# Patient Record
Sex: Male | Born: 1970
Health system: Southern US, Community
[De-identification: ages and names within clinical notes are randomized; demographics above are authoritative.]

## PROBLEM LIST (undated history)

## (undated) DIAGNOSIS — K589 Irritable bowel syndrome without diarrhea: Secondary | ICD-10-CM

---

## 2007-12-15 ENCOUNTER — Other Ambulatory Visit: Payer: Self-pay

## 2007-12-16 ENCOUNTER — Inpatient Hospital Stay: Payer: Self-pay | Admitting: Unknown Physician Specialty

## 2007-12-16 ENCOUNTER — Observation Stay: Payer: Self-pay | Admitting: Internal Medicine

## 2008-01-08 ENCOUNTER — Ambulatory Visit: Payer: Self-pay | Admitting: Psychiatry

## 2008-01-22 ENCOUNTER — Ambulatory Visit: Payer: Self-pay | Admitting: Psychiatry

## 2008-01-29 ENCOUNTER — Ambulatory Visit: Payer: Self-pay | Admitting: Psychiatry

## 2008-12-19 IMAGING — CR DG ABDOMEN 1V
1 series · 2 of 2 positions shown · non-contrast
Comparison: none

REASON FOR EXAM: s/p NG placement
COMMENTS:   Bedside (portable):Y

PROCEDURE:     DXR - DXR KIDNEY URETER BLADDER  - December 16, 2007 [DATE]
RESULT:     Comparison: No available comparison exam.

[Series 1: view not recorded · 0.17mm/px · 2 of 2 slices shown]
[im 1/2]
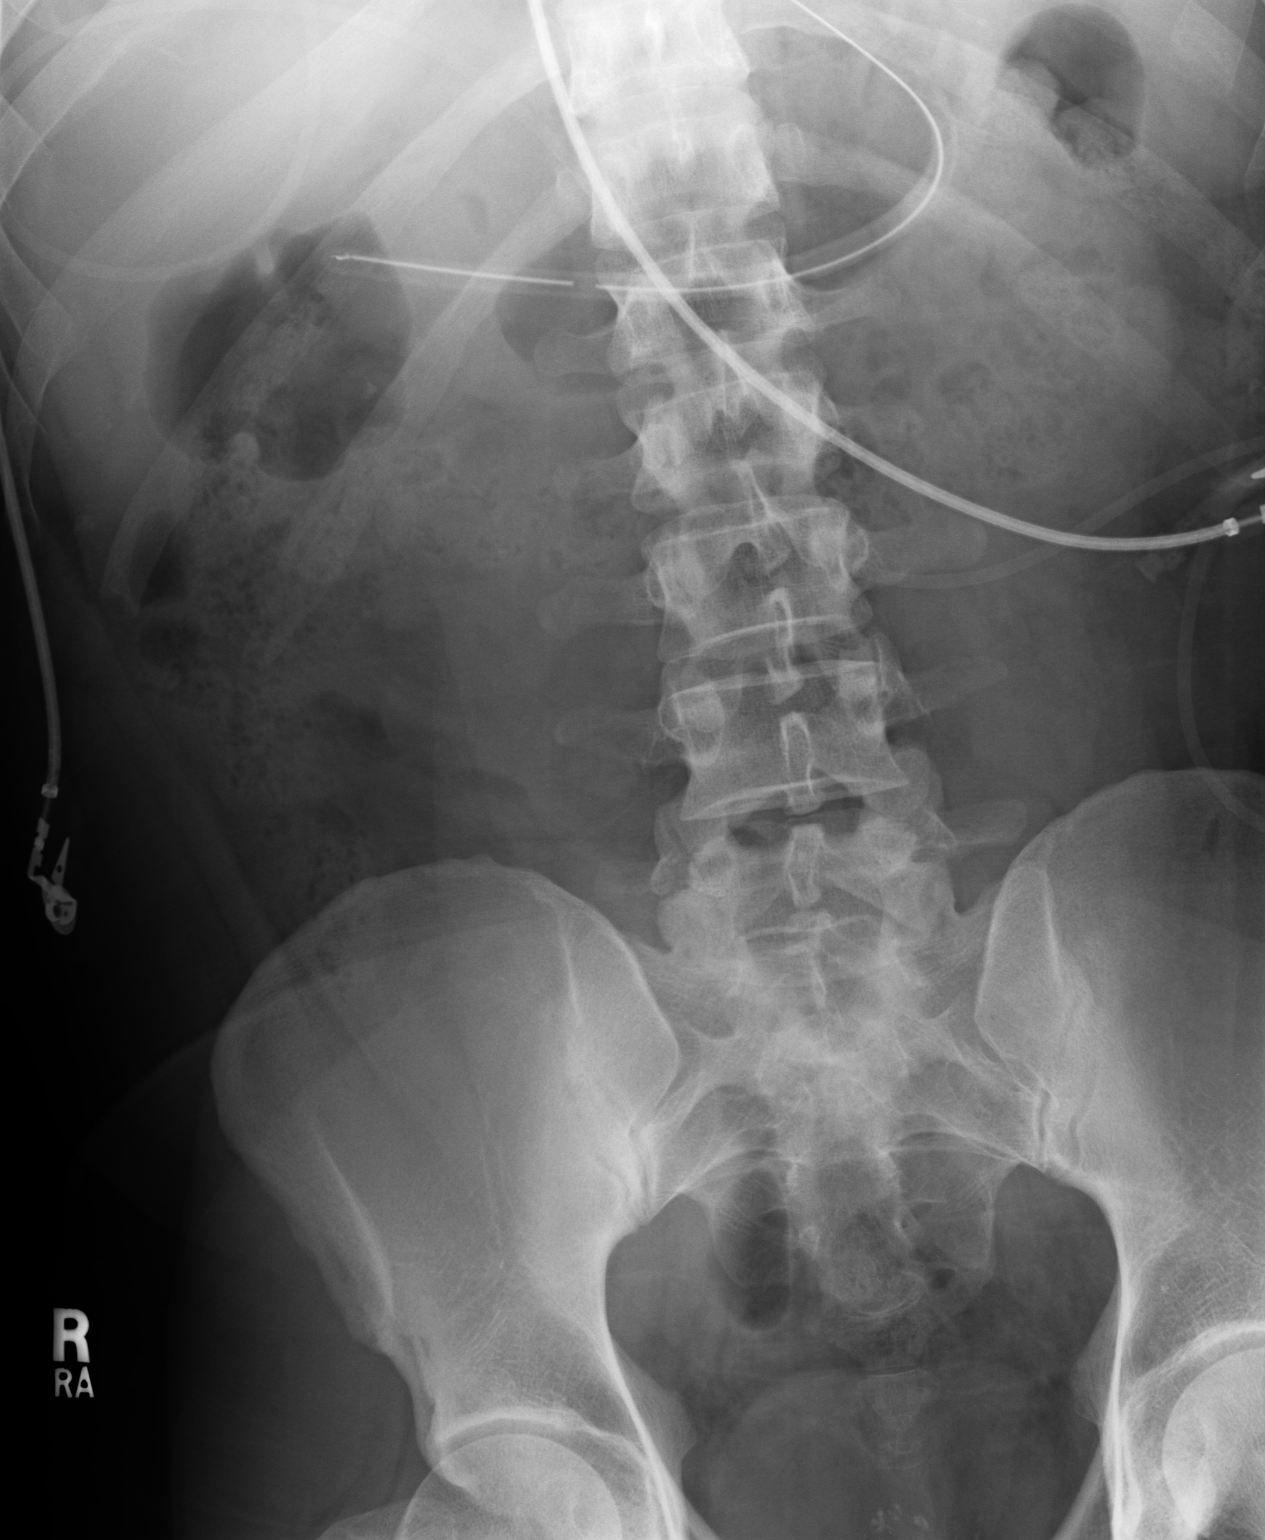
[im 2/2]
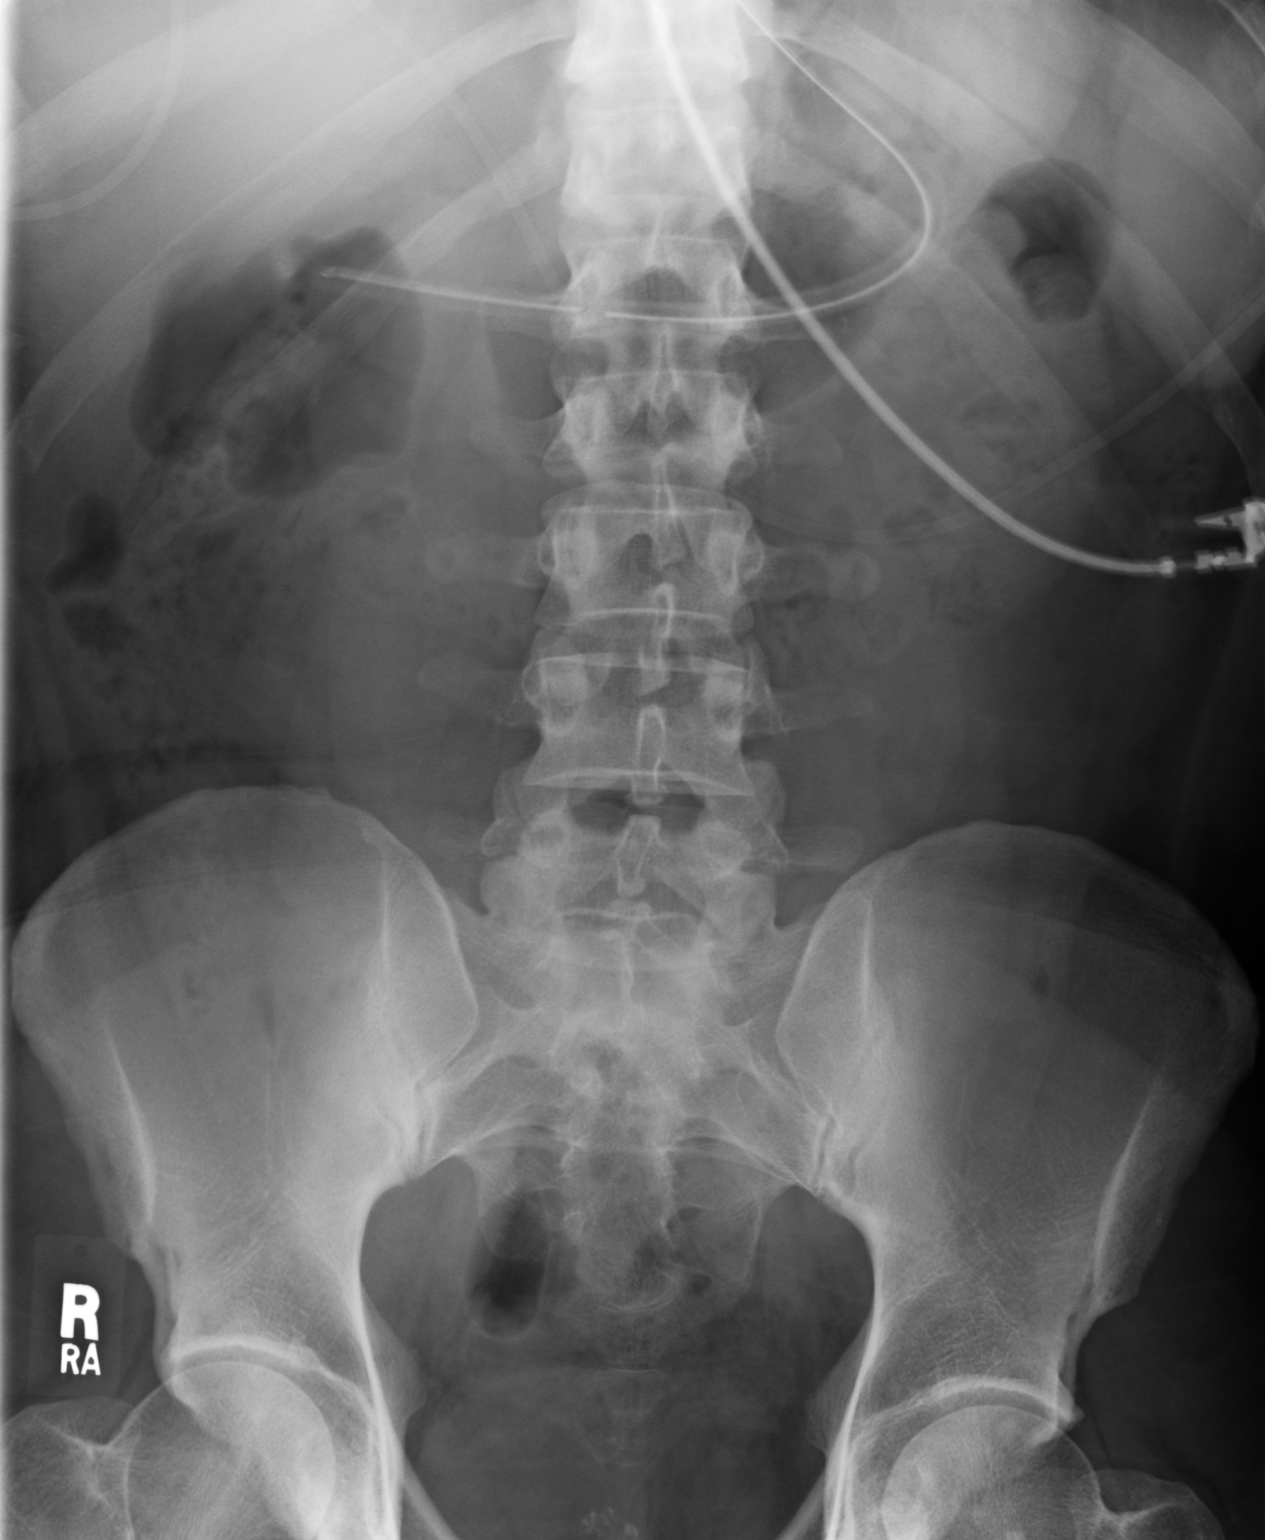

[2 of 2 positions shown; findings below may reference images not displayed]

FINDINGS: Two views of the abdomen were obtained.

The upper abdomen is not fully imaged. Nonobstructive bowel gas pattern.
Nasogastric tube tip is in the distal stomach.
IMPRESSION: 1. Please see above.

## 2009-10-12 DIAGNOSIS — K649 Unspecified hemorrhoids: Secondary | ICD-10-CM | POA: Insufficient documentation

## 2009-10-12 DIAGNOSIS — K589 Irritable bowel syndrome without diarrhea: Secondary | ICD-10-CM | POA: Insufficient documentation

## 2009-10-12 DIAGNOSIS — F32A Depression, unspecified: Secondary | ICD-10-CM | POA: Insufficient documentation

## 2009-10-20 ENCOUNTER — Other Ambulatory Visit: Payer: Self-pay | Admitting: Family Medicine

## 2011-07-10 HISTORY — PX: HEMORRHOID BANDING: SHX5850

## 2011-07-10 HISTORY — PX: COLONOSCOPY: SHX174

## 2011-07-16 ENCOUNTER — Ambulatory Visit: Payer: Self-pay | Admitting: General Surgery

## 2016-03-23 ENCOUNTER — Telehealth: Payer: Self-pay | Admitting: Family Medicine

## 2016-03-23 DIAGNOSIS — M79672 Pain in left foot: Secondary | ICD-10-CM

## 2016-03-23 NOTE — Telephone Encounter (Signed)
Pt is requesting referral for left heal pain.  He started new job in May and has to wear steel toed boots.  He has had pain for few months and is walking on the ball of his foot.

## 2016-04-07 NOTE — Telephone Encounter (Signed)
I thought this was done already. Podiatry referral

## 2016-04-07 NOTE — Telephone Encounter (Signed)
This was not done yet and order put in-aa

## 2016-04-23 ENCOUNTER — Ambulatory Visit: Payer: Self-pay | Admitting: Podiatry

## 2016-05-22 ENCOUNTER — Encounter: Payer: Self-pay | Admitting: Family Medicine

## 2016-05-22 ENCOUNTER — Ambulatory Visit (INDEPENDENT_AMBULATORY_CARE_PROVIDER_SITE_OTHER): Payer: BLUE CROSS/BLUE SHIELD | Admitting: Family Medicine

## 2016-05-22 VITALS — BP 106/60 | HR 64 | Temp 98.2°F | Resp 16 | Ht 75.0 in | Wt 218.0 lb

## 2016-05-22 DIAGNOSIS — Z23 Encounter for immunization: Secondary | ICD-10-CM | POA: Diagnosis not present

## 2016-05-22 DIAGNOSIS — Z0000 Encounter for general adult medical examination without abnormal findings: Secondary | ICD-10-CM | POA: Diagnosis not present

## 2016-05-22 DIAGNOSIS — M722 Plantar fascial fibromatosis: Secondary | ICD-10-CM | POA: Insufficient documentation

## 2016-05-22 LAB — POCT URINALYSIS DIPSTICK
Bilirubin, UA: NEGATIVE
Blood, UA: NEGATIVE
Glucose, UA: NEGATIVE
Ketones, UA: NEGATIVE
Leukocytes, UA: NEGATIVE
Nitrite, UA: NEGATIVE
Protein, UA: NEGATIVE
Spec Grav, UA: 1.02
Urobilinogen, UA: 0.2
pH, UA: 6.5

## 2016-05-22 NOTE — Progress Notes (Signed)
Patient: Julian CousinsDwaine Frank, Male    DOB: 11/24/1970, 45 y.o.   MRN: [id number] Visit Date: 05/22/2016  Today's Provider: Megan Mansichard Gilbert Jr, MD   Chief Complaint  Patient presents with  . Annual Exam   Subjective:    Annual physical exam Julian Frank is a 45 y.o. male who presents today for health maintenance and complete physical. He feels well. He reports exercising regularly. He reports he is sleeping well. He is married and is a father of 2 children, a son age 45 and a daughter age 559. 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 In another 7 months he will be able to work in a job with lab animals that he is actually excited about. He has not enjoyed his present work but wants to continue with this. We had a lengthy discussion regarding depressive symptoms. He is not homicidal and never got relief with any medications in the past and does not wish to start any meds. -----------------------------------------------------------------   Review of Systems  Constitutional: Negative.   HENT: Negative.   Eyes: Negative.   Respiratory: Negative.   Cardiovascular: Negative.   Gastrointestinal: Negative.   Endocrine: Negative.   Genitourinary: Negative.   Musculoskeletal: Negative.   Skin: Negative.   Allergic/Immunologic: Negative.   Neurological: Negative.   Hematological: Negative.   Psychiatric/Behavioral: Negative.     Social History      He  reports that he has never smoked. He has never used smokeless tobacco. He reports that he drinks alcohol. He reports that he does not use drugs.       Social History   Social History  . Marital status: Married    Spouse name: N/A  . Number of children: N/A  . Years of education: N/A    Social History Main Topics  . Smoking status: Never Smoker  . Smokeless tobacco: Never Used  . Alcohol use Yes     Comment: Occasional  . Drug use: No  . Sexual activity: Not Asked   Other Topics Concern  . None   Social History Narrative  . None    History reviewed. No pertinent past medical history.   Patient Active Problem List   Diagnosis Date Noted  . Plantar fasciitis 05/22/2016  . Clinical depression 10/12/2009  . Hemorrhoids without complication 10/12/2009  . Adaptive colitis 10/12/2009    Past Surgical History:  Procedure Laterality Date  . COLONOSCOPY      Family History        Family Status  Relation Status  . Mother Alive  . Father Alive  . Sister Alive  . Maternal Grandmother Deceased  . Sister Alive        His family history includes Alcohol abuse in his mother; Colon cancer in his maternal grandmother; Healthy in his sister and sister; Hypertension in his father.    Allergies  Allergen Reactions  . Penicillins     Current Meds  Medication Sig  . Naproxen Sodium (ALEVE) 220 MG CAPS Take by mouth.  . ranitidine (ZANTAC) 150 MG tablet Take 150 mg by mouth 2 (two) times daily.    Patient Care Team: Maple Hudsonichard L Gilbert Jr., MD as PCP - General (Family Medicine)  Objective:   Vitals: BP 106/60 (BP Location: Right Arm, Patient Position: Sitting, Cuff Size: Normal)   Pulse 64   Temp 98.2 F (36.8 C) (Oral)   Resp 16   Ht 6\' 3"  (1.905 m)   Wt 218 lb (98.9 kg)   BMI 27.25 kg/m    Physical Exam  Constitutional: He is oriented to person, place, and time. He appears well-developed and well-nourished.  HENT:  Head: Normocephalic and atraumatic.  Right Ear: External ear normal.  Left Ear: External ear normal.  Nose: Nose normal.  Mouth/Throat: Oropharynx is clear and moist.  Eyes: Conjunctivae are normal. No scleral icterus.  Neck: Neck supple. No thyromegaly present.  Cardiovascular: Normal rate, regular rhythm, normal heart sounds  and intact distal pulses.   Pulmonary/Chest: Effort normal and breath sounds normal.  Abdominal: Soft.  Genitourinary: Rectum normal, prostate normal and penis normal.  Musculoskeletal: Normal range of motion.  He does complain of left heel pain since starting his new job. He has to wear steel toed shoes and is recently got one with inserts. It is feeling better. It hurts with weightbearing at the end of the day. He is tender over the plantar surface of the heel left heel with no obvious swelling.  Lymphadenopathy:    He has no cervical adenopathy.  Neurological: He is alert and oriented to person, place, and time. He has normal reflexes. He exhibits normal muscle tone. Coordination normal.  Skin: Skin is warm and dry.  Psychiatric: His behavior is normal. Judgment and thought content normal.  Mood/affect slightly flat.     Depression Screen No flowsheet data found.    Assessment & Plan:     Routine Health Maintenance and Physical Exam  Exercise Activities and Dietary recommendations Goals    None       There is no immunization history on file for this patient.  Health Maintenance  Topic Date Due  . HIV Screening  01/12/1986  . TETANUS/TDAP  01/12/1990  . INFLUENZA VACCINE  03/09/2016      Discussed health benefits of physical activity, and encouraged him to engage in regular exercise appropriate for his age and condition.   Left plantar fasciitis Refer to podiatry at any point in time for the patient. At this time treat with naproxen 500 mg twice a day for a month. Depression/adjustment reaction I talked to the patient at length about the situation. Patient declines any medication. Score of  PHQ9 is12. Do think the patient is mildly depressed. May have mild dysthymia. Offered referral to psychiatry. Recommend referral to counselor. Any point in time we will be happy to treat with medications but I would ask to use a mood stabilizer for this patient like Abilify as he  has failed typical SSRIs or SNRIs  in the past.   -------------------------------------------------------------------- I have done the exam and reviewed the above chart and it is accurate to the best of my knowledge.    Richard Wendelyn Breslow, MD  [facility]

## 2016-08-23 ENCOUNTER — Ambulatory Visit (INDEPENDENT_AMBULATORY_CARE_PROVIDER_SITE_OTHER): Payer: BC Managed Care – PPO

## 2016-08-23 ENCOUNTER — Encounter: Payer: Self-pay | Admitting: Podiatry

## 2016-08-23 ENCOUNTER — Ambulatory Visit (INDEPENDENT_AMBULATORY_CARE_PROVIDER_SITE_OTHER): Payer: BC Managed Care – PPO | Admitting: Podiatry

## 2016-08-23 VITALS — BP 126/89 | HR 78

## 2016-08-23 DIAGNOSIS — M79672 Pain in left foot: Secondary | ICD-10-CM | POA: Diagnosis not present

## 2016-08-23 DIAGNOSIS — M722 Plantar fascial fibromatosis: Secondary | ICD-10-CM

## 2016-08-23 MED ORDER — MELOXICAM 15 MG PO TABS: 15.0000 mg | ORAL_TABLET | Freq: Every day | ORAL | 1 refills | Status: AC

## 2016-08-24 ENCOUNTER — Telehealth: Payer: Self-pay

## 2016-08-24 LAB — LIPID PANEL
Chol/HDL Ratio: 3.8 ratio units (ref 0.0–5.0)
Cholesterol, Total: 147 mg/dL (ref 100–199)
HDL: 39 mg/dL — ABNORMAL LOW (ref >39)
LDL Calculated: 95 mg/dL (ref 0–99)
Triglycerides: 66 mg/dL (ref 0–149)
VLDL Cholesterol Cal: 13 mg/dL (ref 5–40)

## 2016-08-24 LAB — CBC WITH DIFFERENTIAL/PLATELET
Basophils Absolute: 0 10*3/uL (ref 0.0–0.2)
Basos: 0 %
EOS (ABSOLUTE): 0.1 10*3/uL (ref 0.0–0.4)
Eos: 2 %
Hematocrit: 46.9 % (ref 37.5–51.0)
Hemoglobin: 16.1 g/dL (ref 13.0–17.7)
Immature Grans (Abs): 0 10*3/uL (ref 0.0–0.1)
Immature Granulocytes: 0 %
Lymphocytes Absolute: 1.5 10*3/uL (ref 0.7–3.1)
Lymphs: 19 %
MCH: 31.5 pg (ref 26.6–33.0)
MCHC: 34.3 g/dL (ref 31.5–35.7)
MCV: 92 fL (ref 79–97)
Monocytes Absolute: 0.6 10*3/uL (ref 0.1–0.9)
Monocytes: 7 %
Neutrophils Absolute: 5.8 10*3/uL (ref 1.4–7.0)
Neutrophils: 72 %
Platelets: 211 10*3/uL (ref 150–379)
RBC: 5.11 x10E6/uL (ref 4.14–5.80)
RDW: 13.2 % (ref 12.3–15.4)
WBC: 8 10*3/uL (ref 3.4–10.8)

## 2016-08-24 LAB — COMPREHENSIVE METABOLIC PANEL
ALT: 10 IU/L (ref 0–44)
AST: 15 IU/L (ref 0–40)
Albumin/Globulin Ratio: 1.8 (ref 1.2–2.2)
Albumin: 4.6 g/dL (ref 3.5–5.5)
Alkaline Phosphatase: 68 IU/L (ref 39–117)
BUN/Creatinine Ratio: 13 (ref 9–20)
BUN: 14 mg/dL (ref 6–24)
Bilirubin Total: 0.4 mg/dL (ref 0.0–1.2)
CO2: 26 mmol/L (ref 18–29)
Calcium: 9.6 mg/dL (ref 8.7–10.2)
Chloride: 105 mmol/L (ref 96–106)
Creatinine, Ser: 1.12 mg/dL (ref 0.76–1.27)
GFR calc Af Amer: 91 mL/min/{1.73_m2} (ref >59)
GFR calc non Af Amer: 79 mL/min/{1.73_m2} (ref >59)
Globulin, Total: 2.6 g/dL (ref 1.5–4.5)
Glucose: 101 mg/dL — ABNORMAL HIGH (ref 65–99)
Potassium: 4.7 mmol/L (ref 3.5–5.2)
Sodium: 146 mmol/L — ABNORMAL HIGH (ref 134–144)
Total Protein: 7.2 g/dL (ref 6.0–8.5)

## 2016-08-24 LAB — PSA: Prostate Specific Ag, Serum: 1 ng/mL (ref 0.0–4.0)

## 2016-08-24 LAB — TSH: TSH: 3.16 u[IU]/mL (ref 0.450–4.500)

## 2016-08-24 NOTE — Telephone Encounter (Signed)
-----   Message from Maple Hudsonichard L Gilbert Jr., MD sent at 08/24/2016  2:42 PM EST ----- Labs ok--borderline prediabetic--diet and exercise will help.

## 2016-08-24 NOTE — Telephone Encounter (Signed)
Left message to call back  

## 2016-08-24 NOTE — Telephone Encounter (Signed)
Advised patient as below.  

## 2016-08-27 MED ORDER — BETAMETHASONE SOD PHOS & ACET 6 (3-3) MG/ML IJ SUSP: 3.0000 mg | Freq: Once | INTRAMUSCULAR | Status: DC

## 2016-08-27 NOTE — Progress Notes (Signed)
   Subjective: Patient presents today for pain and tenderness in the left foot. Patient states the foot pain has been hurting for several weeks now. Patient states that it hurts in the mornings with the first steps out of bed. Patient presents today for further treatment and evaluation Patient currently has a pair of custom molded orthotics. He received his orthotics several years ago. 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 He is on his feet all day long.  Objective: Physical Exam General: The patient is alert and oriented x3 in no acute distress.  Dermatology: Skin is warm, dry and supple bilateral lower extremities. Negative for open lesions or macerations bilateral.   Vascular: Dorsalis Pedis and Posterior Tibial pulses palpable bilateral.  Capillary fill time is immediate to all digits.  Neurological: Epicritic and protective threshold intact bilateral.   Musculoskeletal: Tenderness to palpation at the medial calcaneal tubercale and through the insertion of the plantar fascia of the left foot. All other joints range of motion within normal limits bilateral. Strength 5/5 in all groups bilateral.   Radiographic exam:   Normal osseous mineralization. Joint spaces preserved. No fracture/dislocation/boney destruction. Calcaneal spur present with mild thickening of plantar fascia left. No other soft tissue abnormalities or radiopaque foreign bodies.   Assessment: 1. Plantar fasciitis left foot 2. Pain in left foot  Plan of Care:   1. Patient evaluated. Xrays reviewed.   2. Injection of 0.5cc Celestone soluspan injected into the left plantar fascia.  3. Instructed patient regarding therapies and modalities at home to alleviate symptoms.  4. Rx for meloxicam 15mg  PO given to patient.  5. Plantar fascial band(s) dispensed. 6. Return to clinic in 4 weeks.    Patient's orthotics are covered 80% regardless of deductible. Patient states that he will wait on his  orthotics.  Felecia ShellingBrent M. Thanh Pomerleau, DPM [facility]  Dr. Felecia ShellingBrent M. Matisha Termine, DPM    [address]                                        [place], [place] [address]                Office [phone]) [phone]  Fax [phone](336) [phone]

## 2017-02-26 ENCOUNTER — Ambulatory Visit: Payer: Self-pay | Admitting: Family Medicine

## 2017-02-26 ENCOUNTER — Encounter: Payer: Self-pay | Admitting: Family Medicine

## 2017-02-27 ENCOUNTER — Other Ambulatory Visit: Payer: Self-pay | Admitting: Family Medicine

## 2017-02-27 MED ORDER — ELUXADOLINE 100 MG PO TABS: 100.0000 mg | ORAL_TABLET | Freq: Two times a day (BID) | ORAL | 5 refills | Status: DC

## 2017-04-21 ENCOUNTER — Encounter: Payer: BLUE CROSS/BLUE SHIELD | Admitting: Family Medicine

## 2017-04-27 ENCOUNTER — Encounter: Payer: Self-pay | Admitting: Family Medicine

## 2017-05-17 ENCOUNTER — Other Ambulatory Visit: Payer: Self-pay

## 2017-05-17 DIAGNOSIS — R739 Hyperglycemia, unspecified: Secondary | ICD-10-CM

## 2017-05-17 DIAGNOSIS — Z125 Encounter for screening for malignant neoplasm of prostate: Secondary | ICD-10-CM

## 2017-05-17 DIAGNOSIS — Z0000 Encounter for general adult medical examination without abnormal findings: Secondary | ICD-10-CM

## 2017-05-21 LAB — COMPLETE METABOLIC PANEL WITH GFR
AG Ratio: 1.6 (calc) (ref 1.0–2.5)
ALT: 7 U/L — ABNORMAL LOW (ref 9–46)
AST: 12 U/L (ref 10–40)
Albumin: 4.1 g/dL (ref 3.6–5.1)
Alkaline phosphatase (APISO): 63 U/L (ref 40–115)
BUN: 14 mg/dL (ref 7–25)
CO2: 28 mmol/L (ref 20–32)
Calcium: 9.2 mg/dL (ref 8.6–10.3)
Chloride: 108 mmol/L (ref 98–110)
Creat: 1.11 mg/dL (ref 0.60–1.35)
GFR, Est African American: 92 mL/min/{1.73_m2} (ref >=60)
GFR, Est Non African American: 79 mL/min/{1.73_m2} (ref >=60)
Globulin: 2.6 g/dL (calc) (ref 1.9–3.7)
Glucose, Bld: 95 mg/dL (ref 65–99)
Potassium: 4.5 mmol/L (ref 3.5–5.3)
Sodium: 141 mmol/L (ref 135–146)
Total Bilirubin: 0.8 mg/dL (ref 0.2–1.2)
Total Protein: 6.7 g/dL (ref 6.1–8.1)

## 2017-05-21 LAB — CBC WITH DIFFERENTIAL/PLATELET
Basophils Absolute: 59 cells/uL (ref 0–200)
Basophils Relative: 0.9 %
Eosinophils Absolute: 112 cells/uL (ref 15–500)
Eosinophils Relative: 1.7 %
HCT: 42.6 % (ref 38.5–50.0)
Hemoglobin: 14.8 g/dL (ref 13.2–17.1)
Lymphs Abs: 1379 cells/uL (ref 850–3900)
MCH: 31.8 pg (ref 27.0–33.0)
MCHC: 34.7 g/dL (ref 32.0–36.0)
MCV: 91.6 fL (ref 80.0–100.0)
MPV: 10.8 fL (ref 7.5–12.5)
Monocytes Relative: 8.5 %
Neutro Abs: 4488 cells/uL (ref 1500–7800)
Neutrophils Relative %: 68 %
Platelets: 200 10*3/uL (ref 140–400)
RBC: 4.65 10*6/uL (ref 4.20–5.80)
RDW: 12.3 % (ref 11.0–15.0)
Total Lymphocyte: 20.9 %
WBC mixed population: 561 cells/uL (ref 200–950)
WBC: 6.6 10*3/uL (ref 3.8–10.8)

## 2017-05-21 LAB — HEMOGLOBIN A1C
Hgb A1c MFr Bld: 5.2 % of total Hgb (ref <5.7)
Mean Plasma Glucose: 103 (calc)
eAG (mmol/L): 5.7 (calc)

## 2017-05-21 LAB — LIPID PANEL
Cholesterol: 126 mg/dL (ref <200)
HDL: 33 mg/dL — ABNORMAL LOW (ref >40)
LDL Cholesterol (Calc): 78 mg/dL (calc)
Non-HDL Cholesterol (Calc): 93 mg/dL (calc) (ref <130)
Total CHOL/HDL Ratio: 3.8 (calc) (ref <5.0)
Triglycerides: 71 mg/dL (ref <150)

## 2017-05-21 LAB — TSH: TSH: 1.68 mIU/L (ref 0.40–4.50)

## 2017-05-21 LAB — PSA: PSA: 1.1 ng/mL (ref <=4.0)

## 2017-06-06 ENCOUNTER — Ambulatory Visit (INDEPENDENT_AMBULATORY_CARE_PROVIDER_SITE_OTHER): Payer: BC Managed Care – PPO | Admitting: Family Medicine

## 2017-06-06 ENCOUNTER — Ambulatory Visit (INDEPENDENT_AMBULATORY_CARE_PROVIDER_SITE_OTHER): Payer: BC Managed Care – PPO | Admitting: Podiatry

## 2017-06-06 ENCOUNTER — Encounter: Payer: Self-pay | Admitting: Family Medicine

## 2017-06-06 ENCOUNTER — Ambulatory Visit: Payer: Self-pay

## 2017-06-06 ENCOUNTER — Ambulatory Visit (INDEPENDENT_AMBULATORY_CARE_PROVIDER_SITE_OTHER): Payer: BC Managed Care – PPO

## 2017-06-06 VITALS — BP 108/72 | HR 80 | Temp 98.7°F | Resp 16 | Ht 75.0 in | Wt 217.0 lb

## 2017-06-06 DIAGNOSIS — M722 Plantar fascial fibromatosis: Secondary | ICD-10-CM

## 2017-06-06 DIAGNOSIS — Z23 Encounter for immunization: Secondary | ICD-10-CM

## 2017-06-06 DIAGNOSIS — Z0000 Encounter for general adult medical examination without abnormal findings: Secondary | ICD-10-CM | POA: Diagnosis not present

## 2017-06-06 LAB — POCT URINALYSIS DIPSTICK
Bilirubin, UA: NEGATIVE
Blood, UA: NEGATIVE
Glucose, UA: NEGATIVE
Ketones, UA: NEGATIVE
Leukocytes, UA: NEGATIVE
Nitrite, UA: NEGATIVE
Protein, UA: NEGATIVE
Spec Grav, UA: 1.01 (ref 1.010–1.025)
Urobilinogen, UA: 0.2 E.U./dL
pH, UA: 6.5 (ref 5.0–8.0)

## 2017-06-06 MED ORDER — MELOXICAM 15 MG PO TABS: 15.0000 mg | ORAL_TABLET | Freq: Every day | ORAL | 1 refills | Status: AC

## 2017-06-06 MED ORDER — BETAMETHASONE SOD PHOS & ACET 6 (3-3) MG/ML IJ SUSP: 3.0000 mg | Freq: Once | INTRAMUSCULAR | Status: DC

## 2017-06-06 MED ORDER — METHYLPREDNISOLONE 4 MG PO TBPK: ORAL_TABLET | ORAL | 0 refills | Status: DC

## 2017-06-06 NOTE — Progress Notes (Signed)
Patient: Julian Frank, Male    DOB: 02/26/1971, 46 y.o.   MRN: [id number] Visit Date: 06/06/2017  Today's Provider: Megan Mansichard Ambra Haverstick Jr, MD   Chief Complaint  Patient presents with  . Annual Exam   Subjective:    Annual physical exam Julian Frank is a 46 y.o. male who presents today for health maintenance and complete physical. He feels well. He reports exercising, formally but has a very active job. He reports he is sleeping well. He is starting to enjoy his job at [employer]. -----------------------------------------------------------------   Review of Systems  Constitutional: Negative.   HENT: Negative.   Eyes: Negative.   Respiratory: Negative.   Cardiovascular: Negative.   Gastrointestinal: Negative.   Endocrine: Negative.   Genitourinary: Negative.   Musculoskeletal: Negative.   Skin: Negative.   Allergic/Immunologic: Negative.   Neurological: Negative.   Hematological: Negative.   Psychiatric/Behavioral: Positive for dysphoric mood.    Social History      He  reports that he has never smoked. He has never used smokeless tobacco. He reports that he drinks alcohol. He reports that he does not use drugs.       Social History   Social History  . Marital status: Married    Spouse name: N/A  . Number of children: N/A  . Years of education: N/A   Social History Main Topics  . Smoking status: Never Smoker  . Smokeless tobacco: Never Used  . Alcohol use Yes     Comment: Occasional  . Drug use: No  . Sexual activity: Not Asked   Other Topics Concern  . None   Social History Narrative  . None    History reviewed. No pertinent past medical history.   Patient Active Problem List   Diagnosis Date Noted  . Plantar fasciitis 05/22/2016  . Clinical depression 10/12/2009  . Hemorrhoids without complication 10/12/2009  . Adaptive colitis 10/12/2009    Past Surgical History:  Procedure Laterality Date  . COLONOSCOPY      Family History          Family Status  Relation Status  . Mother Alive  . Father Alive  . Sister Alive  . MGM Deceased  . Sister Alive        His family history includes Alcohol abuse in his mother; Colon cancer in his maternal grandmother; Healthy in his sister and sister; Hypertension in his father.     Allergies  Allergen Reactions  . Penicillins      Current Outpatient Prescriptions:  .  Eluxadoline (VIBERZI) 100 MG TABS, Take 1 tablet (100 mg total) by mouth 2 (two) times daily., Disp: 60 tablet, Rfl: 5 .  meloxicam (MOBIC) 15 MG tablet, Take 1 tablet (15 mg total) by mouth daily., Disp: 60 tablet, Rfl: 1 .  ranitidine (ZANTAC) 150 MG tablet, Take 150 mg by mouth 2 (two) times daily., Disp: , Rfl:  .  methylPREDNISolone (MEDROL DOSEPAK) 4 MG TBPK tablet, 6 day dose pack - take as directed (Patient not taking: Reported on 06/06/2017), Disp: 21 tablet, Rfl: 0 .  XIFAXAN 550 MG TABS tablet, TAKE ONE TABLET BY MOUTH 3 TIMES DAILY FOR 14 DAYS, Disp: , Rfl: 0  Current Facility-Administered Medications:  .  betamethasone acetate-betamethasone sodium phosphate (CELESTONE) injection 3 mg, 3 mg, Intramuscular, Once, Evans, Brent M, DPM .  betamethasone acetate-betamethasone sodium phosphate (CELESTONE) injection 3 mg, 3 mg, Intramuscular, Once, Felecia ShellingEvans, Brent M, DPM   Patient Care Team: Bosie ClosGilbert, Saralynn Langhorst L  Montez Hageman., MD as PCP - General (Family Medicine)      Objective:   Vitals: BP 108/72 (BP Location: Left Arm, Patient Position: Sitting, Cuff Size: Large)   Pulse 80   Temp 98.7 F (37.1 C) (Oral)   Resp 16   Ht 6\' 3"  (1.905 m)   Wt 217 lb (98.4 kg)   BMI 27.12 kg/m    Vitals:   06/06/17 1613  BP: 108/72  Pulse: 80  Resp: 16  Temp: 98.7 F (37.1 C)  TempSrc: Oral  Weight: 217 lb (98.4 kg)  Height: 6\' 3"  (1.905 m)     Physical Exam  Constitutional: He is oriented to person, place, and time. He appears well-developed and well-nourished.  HENT:  Head: Normocephalic and atraumatic.  Right Ear:  External ear normal.  Left Ear: External ear normal.  Nose: Nose normal.  Mouth/Throat: Oropharynx is clear and moist.  Eyes: Pupils are equal, round, and reactive to light. Conjunctivae and EOM are normal. No scleral icterus.  Neck: Neck supple. No thyromegaly present.  Cardiovascular: Normal rate, regular rhythm, normal heart sounds and intact distal pulses.   Pulmonary/Chest: Effort normal and breath sounds normal.  Abdominal: Soft.  Genitourinary: Rectum normal, prostate normal and penis normal.  Musculoskeletal: Normal range of motion.  Lymphadenopathy:    He has no cervical adenopathy.  Neurological: He is alert and oriented to person, place, and time.  Skin: Skin is warm and dry.  Fair skin.  Psychiatric: He has a normal mood and affect. His behavior is normal. Judgment and thought content normal.     Depression Screen PHQ 2/9 Scores 06/06/2017  PHQ - 2 Score 6  PHQ- 9 Score 11      Assessment & Plan:     Routine Health Maintenance and Physical Exam  Exercise Activities and Dietary recommendations Goals    None      Immunization History  Administered Date(s) Administered  . Influenza Split 06/18/2006, 06/23/2007, 05/04/2008, 05/21/2011, 08/21/2012  . Influenza,inj,Quad PF,6+ Mos 06/07/2013, 05/22/2016  . Tdap 05/04/2008    Health Maintenance  Topic Date Due  . HIV Screening  01/12/1986  . INFLUENZA VACCINE  03/09/2017  . TETANUS/TDAP  05/04/2018     Discussed health benefits of physical activity, and encouraged him to engage in regular exercise appropriate for his age and condition.  Depression Stable-strongly recommend counseling for pt. Plantar Fasciits IBS Better.   --------------------------------------------------------------------   I have done the exam and reviewed the above chart and it is accurate to the best of my knowledge. Dentist has been used in this note in any air is in the dictation or transcription are  unintentional. I have done the exam and reviewed the above chart and it is accurate to the best of my knowledge. Dentist has been used in this note in any air is in the dictation or transcription are unintentional.  Megan Mans, MD  [facility]

## 2017-06-08 NOTE — Progress Notes (Signed)
   Subjective: Patient presents today for follow up evaluation of left plantar fasciitis. He states the left foot pain has improved somewhat but he is now experiencing similar symptoms in the right foot. He states he stands in steel toed boots for 7 hours daily for work. He describes the pain in the right foot as sharp and shooting and ongoing for the past 6 months. He is here for further evaluation and treatment.   No past medical history on file.   Objective: Physical Exam General: The patient is alert and oriented x3 in no acute distress.  Dermatology: Skin is warm, dry and supple bilateral lower extremities. Negative for open lesions or macerations bilateral.   Vascular: Dorsalis Pedis and Posterior Tibial pulses palpable bilateral.  Capillary fill time is immediate to all digits.  Neurological: Epicritic and protective threshold intact bilateral.   Musculoskeletal: Tenderness to palpation at the medial calcaneal tubercale and through the insertion of the plantar fascia of the right foot. All other joints range of motion within normal limits bilateral. Strength 5/5 in all groups bilateral.   Radiographic exam:   Normal osseous mineralization. Joint spaces preserved. No fracture/dislocation/boney destruction. Calcaneal spur present with mild thickening of plantar fascia left. No other soft tissue abnormalities or radiopaque foreign bodies.   Assessment: 1. Plantar fasciitis right foot 2. Pain in right foot  Plan of Care:   1. Patient evaluated. Xrays reviewed.   2. Injection of 0.5cc Celestone soluspan injected into the right plantar fascia.  3. Prescription for meloxicam 15 mg given to patient. 4. Prescription for prednisone Dosepak given to patient. 5. Plantar fascial brace dispensed. 6. Continue using Protalus OTC insoles.   7. Return to clinic in 4 weeks.  Dr. Carlynn PurlSowles' husband.   Felecia ShellingBrent M. Antia Rahal, DPM [facility]  Dr. Felecia ShellingBrent M. Rashanna Christiana, DPM    [address]                                        [place], [place] [address]                Office [phone]  Fax [phone]) [phone]

## 2017-07-04 ENCOUNTER — Ambulatory Visit: Payer: BC Managed Care – PPO | Admitting: Podiatry

## 2017-08-11 ENCOUNTER — Telehealth: Payer: Self-pay | Admitting: Family Medicine

## 2017-08-11 MED ORDER — ELUXADOLINE 75 MG PO TABS: 1.0000 | ORAL_TABLET | ORAL | 1 refills | Status: DC

## 2017-08-11 MED ORDER — ELUXADOLINE 100 MG PO TABS: 100.0000 mg | ORAL_TABLET | Freq: Every evening | ORAL | 1 refills | Status: DC

## 2017-08-11 NOTE — Telephone Encounter (Signed)
Please advise. Is this ok? Chart only has dose for 100 mg.-Anastasiya V Hopkins, RMA

## 2017-08-11 NOTE — Telephone Encounter (Signed)
ok 

## 2017-08-11 NOTE — Telephone Encounter (Signed)
Done-Niguel Moure V Jaemarie Hochberg, RMA  

## 2017-08-11 NOTE — Telephone Encounter (Signed)
Helen from [facility] called requesting refill on Eluxadoline (VIBERZI) 100 MG TABS to be taken in the PM.Also needs Viberzi 75 MG to be taken in AM.Would like 90 day supply [facility]

## 2017-09-22 ENCOUNTER — Telehealth: Payer: Self-pay | Admitting: Family Medicine

## 2017-09-22 DIAGNOSIS — Z20828 Contact with and (suspected) exposure to other viral communicable diseases: Secondary | ICD-10-CM

## 2017-09-22 MED ORDER — OSELTAMIVIR PHOSPHATE 75 MG PO CAPS
75.0000 mg | ORAL_CAPSULE | Freq: Every day | ORAL | 0 refills | Status: DC
Start: 2017-09-22 — End: 2017-10-20

## 2017-09-22 NOTE — Telephone Encounter (Signed)
Pt would like prescription for Tamiflu sent to [facility].He has been exposed through family member

## 2017-09-22 NOTE — Telephone Encounter (Signed)
Please advise. Thanks.  

## 2017-09-22 NOTE — Telephone Encounter (Signed)
Pt informed. Med sent

## 2017-09-22 NOTE — Telephone Encounter (Signed)
75 mg daily for 5 days.

## 2017-09-30 ENCOUNTER — Encounter: Payer: Self-pay | Admitting: *Deleted

## 2017-10-06 ENCOUNTER — Ambulatory Visit: Payer: BC Managed Care – PPO | Admitting: General Surgery

## 2017-10-06 ENCOUNTER — Encounter: Payer: Self-pay | Admitting: General Surgery

## 2017-10-06 VITALS — BP 122/78 | HR 76 | Resp 12 | Ht 75.0 in | Wt 213.0 lb

## 2017-10-06 DIAGNOSIS — K648 Other hemorrhoids: Secondary | ICD-10-CM

## 2017-10-06 NOTE — Progress Notes (Signed)
Patient ID: Julian Frank, male   DOB: 12/09/1970, 47 y.o.   MRN: [id number]  Chief Complaint  Patient presents with  . Hemorrhoids    HPI Julian Frank is a 47 y.o. male here today for a evalaution of hemorrhoids. Patient states when he moves his bowels he see bright red blood on the paper and in the bowl No pain.Marland Kitchen.He has had hemorrhoid banding done in 2012 with monthly episodes where he would have spots of bleeding sincethat time. Moves is bowels three times daily.   He has had two spells of bleeding out of the blue. He started Viberzi for his IBS with diarrhea and his bowel movements have become more formed.    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  HPI  No past medical history on file.  Past Surgical History:  Procedure Laterality Date  . COLONOSCOPY  07/2011  . HEMORRHOID BANDING  07/2011    Family History  Problem Relation Age of Onset  . Alcohol abuse Mother   . Hypertension Father   . Healthy Sister   . Colon cancer Maternal Grandmother   . Healthy Sister     Social History Social History   Tobacco Use  . Smoking status: Never Smoker  . Smokeless tobacco: Never Used  Substance Use Topics  . Alcohol use: Yes    Comment: Occasional  . Drug use: No    Allergies  Allergen Reactions  . Penicillins     Current Outpatient Medications  Medication Sig Dispense Refill  . Eluxadoline (VIBERZI) 100 MG TABS Take 1 tablet (100 mg total) by mouth every evening. 90 tablet 1  . Eluxadoline (VIBERZI) 75 MG TABS Take 1 tablet by mouth every morning. 90 tablet 1  . methylPREDNISolone (MEDROL DOSEPAK) 4 MG TBPK tablet 6 day dose pack - take as directed 21 tablet 0  . oseltamivir (TAMIFLU) 75 MG capsule Take 1 capsule (75 mg total) by mouth daily. 5 capsule 0  . ranitidine (ZANTAC) 150 MG tablet Take 150 mg by mouth 2 (two) times daily.    Marland Kitchen. XIFAXAN 550 MG TABS tablet TAKE ONE TABLET BY MOUTH 3 TIMES DAILY FOR 14 DAYS  0   Current Facility-Administered  Medications  Medication Dose Route Frequency Provider Last Rate Last Dose  . betamethasone acetate-betamethasone sodium phosphate (CELESTONE) injection 3 mg  3 mg Intramuscular Once Gala LewandowskyEvans, Brent M, DPM      . betamethasone acetate-betamethasone sodium phosphate (CELESTONE) injection 3 mg  3 mg Intramuscular Once Felecia ShellingEvans, Brent M, DPM        Review of Systems Review of Systems  Constitutional: Negative.   Respiratory: Negative.   Cardiovascular: Negative.     Blood pressure 122/78, pulse 76, resp. rate 12, height 6\' 3"  (1.905 m), weight 213 lb (96.6 kg).  Physical Exam Physical Exam  Constitutional: He is oriented to person, place, and time. He appears well-developed and well-nourished.  HENT:  Mouth/Throat: Oropharynx is clear and moist.  Eyes: Conjunctivae are normal. No scleral icterus.  Neck: Neck supple.  Cardiovascular: Normal rate, regular rhythm and normal heart sounds.  Pulmonary/Chest: Effort normal and breath sounds normal.  Abdominal: Soft. There is no tenderness.  Genitourinary: Prostate normal. Rectal exam shows internal hemorrhoid. Rectal exam shows no external hemorrhoid.  Genitourinary Comments: Large internal hemorrhoids  Lymphadenopathy:    He has no cervical adenopathy.  Neurological: He is alert and oriented to person, place, and time.  Skin: Skin is warm and dry.  Psychiatric: His behavior  is normal.    Data Reviewed Anoscopy showed internal hemorrhoids, primarily anteriorly.  The patient was amenable to banding.  Double bands were placed in a sensate hemorrhoids without difficulty.  Assessment    Bleeding from internal hemorrhoids.    Plan       We will plan for a follow-up exam in 2 weeks and reassess his progress and band the minor lesions at that time if still symptomatic.   HPI, Physical Exam, Assessment and Plan have been scribed under the direction and in the presence of Earline Mayotte, MD. Dorathy Daft, RN  Merrily Pew Doriann Zuch 10/07/2017,  7:36 PM

## 2017-10-06 NOTE — Patient Instructions (Signed)
The patient is aware to call back for any questions or concerns.  

## 2017-10-07 DIAGNOSIS — K648 Other hemorrhoids: Secondary | ICD-10-CM | POA: Insufficient documentation

## 2017-10-20 ENCOUNTER — Encounter: Payer: Self-pay | Admitting: General Surgery

## 2017-10-20 ENCOUNTER — Ambulatory Visit (INDEPENDENT_AMBULATORY_CARE_PROVIDER_SITE_OTHER): Payer: BC Managed Care – PPO | Admitting: General Surgery

## 2017-10-20 VITALS — BP 144/86 | HR 72 | Resp 14 | Ht 75.0 in | Wt 216.0 lb

## 2017-10-20 DIAGNOSIS — K648 Other hemorrhoids: Secondary | ICD-10-CM

## 2017-10-20 NOTE — Progress Notes (Signed)
Patient ID: Julian CousinsDwaine Mcneill, male   DOB: 09/04/1970, 47 y.o.   MRN: [id number]  Chief Complaint  Patient presents with  . Follow-up    HPI Julian CousinsDwaine Hammar is a 47 y.o. male.  Here for follow up internal hemorrhoids post banding. He states he only notices spotting on the tissue after BM. No pain, only a discomfort with BM. Bowels moving well on Viberzia.  HPI  No past medical history on file.  Past Surgical History:  Procedure Laterality Date  . COLONOSCOPY  07/2011  . HEMORRHOID BANDING  07/2011    Family History  Problem Relation Age of Onset  . Alcohol abuse Mother   . Hypertension Father   . Healthy Sister   . Colon cancer Maternal Grandmother   . Healthy Sister     Social History Social History   Tobacco Use  . Smoking status: Never Smoker  . Smokeless tobacco: Never Used  Substance Use Topics  . Alcohol use: Yes    Comment: Occasional  . Drug use: No    Allergies  Allergen Reactions  . Penicillins     Current Outpatient Medications  Medication Sig Dispense Refill  . Eluxadoline (VIBERZI) 100 MG TABS Take 1 tablet (100 mg total) by mouth every evening. 90 tablet 1  . Eluxadoline (VIBERZI) 75 MG TABS Take 1 tablet by mouth every morning. 90 tablet 1  . ranitidine (ZANTAC) 150 MG tablet Take 150 mg by mouth 2 (two) times daily.     Current Facility-Administered Medications  Medication Dose Route Frequency Provider Last Rate Last Dose  . betamethasone acetate-betamethasone sodium phosphate (CELESTONE) injection 3 mg  3 mg Intramuscular Once Gala LewandowskyEvans, Brent M, DPM      . betamethasone acetate-betamethasone sodium phosphate (CELESTONE) injection 3 mg  3 mg Intramuscular Once Felecia ShellingEvans, Brent M, DPM        Review of Systems Review of Systems  Constitutional: Negative.   Respiratory: Negative.   Cardiovascular: Negative.     Blood pressure (!) 144/86, pulse 72, resp. rate 14, height 6\' 3"  (1.905 m), weight 216 lb (98 kg), SpO2 99 %.  Physical Exam Physical Exam   Constitutional: He is oriented to person, place, and time. He appears well-developed and well-nourished.  Genitourinary:  Genitourinary Comments: Mild swelling of internal hemorrhoid  Neurological: He is alert and oriented to person, place, and time.  Skin: Skin is warm and dry.  Psychiatric: His behavior is normal.    Data Reviewed Anoscopy shows anterior hemorrhoids at site of previous banding without active bleeding, smaller volume.  Assessment    Internal hemorrhoids, improved post banding.    Plan    Follow up as needed. The patient is aware to call back for any questions or new concerns. OTC treatment options discussed       HPI, Physical Exam, Assessment and Plan have been scribed under the direction and in the presence of Earline MayotteJeffrey W. Le Ferraz, MD. Dorathy DaftMarsha Hatch, RN  I have completed the exam and reviewed the above documentation for accuracy and completeness.  I agree with the above.  Museum/gallery conservatorDragon Technology has been used and any errors in dictation or transcription are unintentional.  Donnalee CurryJeffrey Rushie Brazel, M.D., F.A.C.S.  Merrily PewJeffrey W Rosser [place] 10/21/2017, 8:25 PM

## 2017-10-20 NOTE — Patient Instructions (Signed)
The patient is aware to call back for any questions or concerns.  

## 2018-04-25 ENCOUNTER — Encounter: Payer: Self-pay | Admitting: Family Medicine

## 2018-04-25 ENCOUNTER — Ambulatory Visit (INDEPENDENT_AMBULATORY_CARE_PROVIDER_SITE_OTHER): Payer: BC Managed Care – PPO | Admitting: Family Medicine

## 2018-04-25 VITALS — BP 118/68 | HR 84 | Temp 99.0°F | Resp 16 | Ht 75.0 in | Wt 211.0 lb

## 2018-04-25 DIAGNOSIS — Z23 Encounter for immunization: Secondary | ICD-10-CM | POA: Diagnosis not present

## 2018-04-25 DIAGNOSIS — K589 Irritable bowel syndrome without diarrhea: Secondary | ICD-10-CM

## 2018-04-25 DIAGNOSIS — F3341 Major depressive disorder, recurrent, in partial remission: Secondary | ICD-10-CM | POA: Diagnosis not present

## 2018-04-25 MED ORDER — ELUXADOLINE 75 MG PO TABS: 1.0000 | ORAL_TABLET | ORAL | 3 refills | Status: DC

## 2018-04-25 MED ORDER — ELUXADOLINE 100 MG PO TABS: 100.0000 mg | ORAL_TABLET | Freq: Every evening | ORAL | 3 refills | Status: DC

## 2018-04-25 NOTE — Progress Notes (Signed)
Patient: Julian CousinsDwaine Ent Male    DOB: 02/28/1971   47 y.o.   MRN: [id number] Visit Date: 04/25/2018  Today's Provider: Megan Mansichard Gilbert Jr, MD   Chief Complaint  Patient presents with  . Follow-up  . Irritable Bowel Syndrome   Subjective:    HPI Patient comes in today for a follow up. He was last seen in the office 11 months ago. He reports that he needs refills on Viberzi. He reports that it controls his symptoms very well.  He feels well. He is leaving his job at [employer] and [place]    Allergies  Allergen Reactions  . Penicillins      Current Outpatient Medications:  .  Eluxadoline (VIBERZI) 100 MG TABS, Take 1 tablet (100 mg total) by mouth every evening., Disp: 90 tablet, Rfl: 1 .  Eluxadoline (VIBERZI) 75 MG TABS, Take 1 tablet by mouth every morning., Disp: 90 tablet, Rfl: 1 .  ranitidine (ZANTAC) 150 MG tablet, Take 150 mg by mouth 2 (two) times daily., Disp: , Rfl:   Current Facility-Administered Medications:  .  betamethasone acetate-betamethasone sodium phosphate (CELESTONE) injection 3 mg, 3 mg, Intramuscular, Once, Evans, Brent M, DPM .  betamethasone acetate-betamethasone sodium phosphate (CELESTONE) injection 3 mg, 3 mg, Intramuscular, Once, Felecia ShellingEvans, Brent M, DPM  Review of Systems  Constitutional: Negative for activity change, appetite change, chills, diaphoresis, fatigue, fever and unexpected weight change.  HENT: Negative.   Eyes: Negative.   Respiratory: Negative for cough and shortness of breath.   Cardiovascular: Negative for chest pain, palpitations and leg swelling.  Gastrointestinal: Negative for abdominal distention, abdominal pain, anal bleeding, blood in stool, constipation, diarrhea, nausea and rectal pain.  Musculoskeletal: Negative for arthralgias, gait problem and myalgias.  Neurological: Negative for dizziness, light-headedness and headaches.  Psychiatric/Behavioral: Negative.     Social History   Tobacco Use   . Smoking status: Never Smoker  . Smokeless tobacco: Never Used  Substance Use Topics  . Alcohol use: Yes    Comment: Occasional   Objective:   BP 118/68 (BP Location: Right Arm, Patient Position: Sitting, Cuff Size: Normal)   Pulse 84   Temp 99 F (37.2 C)   Resp 16   Ht 6\' 3"  (1.905 m)   Wt 211 lb (95.7 kg)   SpO2 97%   BMI 26.37 kg/m  Vitals:   04/25/18 1451  BP: 118/68  Pulse: 84  Resp: 16  Temp: 99 F (37.2 C)  SpO2: 97%  Weight: 211 lb (95.7 kg)  Height: 6\' 3"  (1.905 m)     Physical Exam  Constitutional: He is oriented to person, place, and time. He appears well-developed and well-nourished.  HENT:  Head: Normocephalic and atraumatic.  Right Ear: External ear normal.  Left Ear: External ear normal.  Nose: Nose normal.  Eyes: No scleral icterus.  Neck: No thyromegaly present.  Cardiovascular: Normal rate, regular rhythm and normal heart sounds.  Pulmonary/Chest: Effort normal and breath sounds normal.  Abdominal: Soft.  Musculoskeletal: He exhibits no edema.  Neurological: He is alert and oriented to person, place, and time.  Skin: Skin is warm and dry.  Psychiatric: He has a normal mood and affect. His behavior is normal. Judgment and thought content normal.        Assessment & Plan:     1. Irritable bowel syndrome, unspecified type CPE 2020. - Eluxadoline (VIBERZI) 75 MG TABS; Take 1 tablet by mouth every morning.  Dispense:  90 tablet; Refill: 3 - Eluxadoline (VIBERZI) 100 MG TABS; Take 1 tablet (100 mg total) by mouth at bedtime.  Dispense: 90 tablet; Refill: 3  2. Flu vaccine need  - Flu Vaccine QUAD 6+ mos PF IM (Fluarix Quad PF) 3.MDD Stable.      I have done the exam and reviewed the above chart and it is accurate to the best of my knowledge. Dentist has been used in this note in any air is in the dictation or transcription are unintentional.  Megan Mans, MD  [facility]

## 2018-04-28 MED ORDER — ELUXADOLINE 100 MG PO TABS: 100.0000 mg | ORAL_TABLET | Freq: Every day | ORAL | 3 refills | Status: DC

## 2018-04-28 MED ORDER — ELUXADOLINE 75 MG PO TABS: 1.0000 | ORAL_TABLET | ORAL | 3 refills | Status: DC

## 2018-06-01 DIAGNOSIS — H5213 Myopia, bilateral: Secondary | ICD-10-CM | POA: Diagnosis not present

## 2018-06-01 DIAGNOSIS — H52223 Regular astigmatism, bilateral: Secondary | ICD-10-CM | POA: Diagnosis not present

## 2018-06-01 DIAGNOSIS — H524 Presbyopia: Secondary | ICD-10-CM | POA: Diagnosis not present

## 2018-06-19 ENCOUNTER — Other Ambulatory Visit: Payer: Self-pay | Admitting: Family Medicine

## 2018-06-19 DIAGNOSIS — K589 Irritable bowel syndrome without diarrhea: Secondary | ICD-10-CM

## 2018-06-30 ENCOUNTER — Telehealth: Payer: Self-pay | Admitting: Family Medicine

## 2018-06-30 NOTE — Telephone Encounter (Signed)
Patient's wife came in stating that the patient has not received his 90 day supply of VIBERZI 100 MG TABS and VIBERZI 75 MG TABS.  She states that the pharmacy says they do not have this.  I let her know that they may be waiting on a prior authorization.  We have received several faxes on this for prior authorization.  Please let her know what is going on with this.

## 2018-07-03 NOTE — Telephone Encounter (Signed)
PA was denied on 06/28/18. Spoke with Jasmine on 06/29/18 to resubmit information to insurance Data processing manager([employer]). She reports that it could take 24-72hours before processing again. Called insurance on 06/30/18 and spoke with Lilly and she reports that PA was still pending.   Called again today at 8:33am, and spoke with Morrie SheldonAshley and she was having trouble finding the PA that was resubmitted on 11/21. She reports that she will email the PA dept and get back in touch by phone or fax to let me know the status of the PA. Both fax and phone number were given. She reports that response will take no more than 2 hours.

## 2018-07-04 NOTE — Telephone Encounter (Signed)
Have tried calling patient and wife about this. Will try again later.

## 2018-07-07 NOTE — Telephone Encounter (Signed)
Medication with new info was denied. Left message on 11/27 advising wife.

## 2018-08-03 ENCOUNTER — Telehealth: Payer: Self-pay | Admitting: Family Medicine

## 2018-08-03 DIAGNOSIS — K589 Irritable bowel syndrome without diarrhea: Secondary | ICD-10-CM

## 2018-08-03 MED ORDER — ELUXADOLINE 100 MG PO TABS: 1.0000 | ORAL_TABLET | Freq: Every evening | ORAL | 1 refills | Status: DC

## 2018-08-03 NOTE — Telephone Encounter (Signed)
Pt's requesting VIBERZI 100 MG TABS [facility]  Pt states in the notes of the RX we must put patient has failed Elevin and Xifaxan .  Pt is out for the 100 MG and will be going out of town tomorrow.

## 2018-08-03 NOTE — Telephone Encounter (Signed)
Refilled for him.  

## 2018-08-16 ENCOUNTER — Other Ambulatory Visit: Payer: Self-pay

## 2018-08-16 MED ORDER — ELUXADOLINE 75 MG PO TABS: 1.0000 | ORAL_TABLET | Freq: Two times a day (BID) | ORAL | 3 refills | Status: DC

## 2018-08-16 NOTE — Telephone Encounter (Signed)
Patient needs refill on meds. Please review. Thanks!

## 2018-08-31 ENCOUNTER — Ambulatory Visit (INDEPENDENT_AMBULATORY_CARE_PROVIDER_SITE_OTHER): Payer: 59 | Admitting: Family Medicine

## 2018-08-31 ENCOUNTER — Encounter: Payer: Self-pay | Admitting: Family Medicine

## 2018-08-31 VITALS — BP 116/76 | HR 72 | Temp 97.7°F | Ht 75.0 in | Wt 216.6 lb

## 2018-08-31 DIAGNOSIS — Z23 Encounter for immunization: Secondary | ICD-10-CM | POA: Diagnosis not present

## 2018-08-31 DIAGNOSIS — Z0000 Encounter for general adult medical examination without abnormal findings: Secondary | ICD-10-CM | POA: Diagnosis not present

## 2018-08-31 DIAGNOSIS — K589 Irritable bowel syndrome without diarrhea: Secondary | ICD-10-CM

## 2018-08-31 DIAGNOSIS — F3341 Major depressive disorder, recurrent, in partial remission: Secondary | ICD-10-CM

## 2018-08-31 LAB — POCT URINALYSIS DIPSTICK
Bilirubin, UA: NEGATIVE
Blood, UA: NEGATIVE
Glucose, UA: NEGATIVE
Ketones, UA: NEGATIVE
Leukocytes, UA: NEGATIVE
Nitrite, UA: NEGATIVE
Protein, UA: NEGATIVE
Spec Grav, UA: 1.015 (ref 1.010–1.025)
Urobilinogen, UA: 0.2 E.U./dL
pH, UA: 5 (ref 5.0–8.0)

## 2018-08-31 MED ORDER — BUPROPION HCL ER (XL) 150 MG PO TB24: 150.0000 mg | ORAL_TABLET | Freq: Every day | ORAL | 3 refills | Status: DC

## 2018-08-31 NOTE — Progress Notes (Signed)
Patient: Julian Frank, Male    DOB: [birth year], 48 y.o.   MRN: [id number] Visit Date: 08/31/2018  Today's Provider: Megan Mans, MD   Chief Complaint  Patient presents with  . Annual Exam   Subjective:    Annual physical exam Julian Frank is a 48 y.o. male who presents today for health maintenance and complete physical. He feels well. He reports exercising not regularly, but he does stay active. He reports he is sleeping well. He is married.  Father of 2.  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  Tdap-05/04/2008.      Review of Systems  Constitutional: Negative.   HENT: Negative.   Eyes: Positive for photophobia.  Respiratory: Negative.   Cardiovascular: Negative.   Gastrointestinal: Negative.   Endocrine: Negative.   Genitourinary: Negative.   Musculoskeletal: Positive for neck stiffness.  Skin: Negative.   Allergic/Immunologic: Negative.   Neurological: Negative.   Hematological: Negative.   Psychiatric/Behavioral: Negative.     Social History      He  reports that he has never smoked. He has never used smokeless tobacco. He reports current alcohol use. He reports that he does not use drugs.       Social History   Socioeconomic History  . Marital status: Married    Spouse name: Not on file  . Number of children: Not on file  . Years of education: Not on file  . Highest education level: Not on file  Occupational History  . Not on file  Social Needs  . Financial resource strain: Not on file  . Food insecurity:    Worry: Not on file    Inability: Not on file  . Transportation needs:    Medical: Not on file    Non-medical: Not on file  Tobacco Use  . Smoking status: Never Smoker  . Smokeless tobacco: Never Used  Substance and Sexual Activity  . Alcohol use: Yes    Comment: Occasional    . Drug use: No  . Sexual activity: Not on file  Lifestyle  . Physical activity:    Days per week: Not on file    Minutes per session: Not on file  . Stress: Not on file  Relationships  . Social connections:    Talks on phone: Not on file    Gets together: Not on file    Attends religious service: Not on file    Active member of club or organization: Not on file    Attends meetings of clubs or organizations: Not on file    Relationship status: Not on file  Other Topics Concern  . Not on file  Social History Narrative  . Not on file    No past medical history on file.   Patient Active Problem List   Diagnosis Date Noted  . Internal hemorrhoids 10/07/2017  . Plantar fasciitis 05/22/2016  . Clinical depression 10/12/2009  . Hemorrhoids without complication 10/12/2009  . Adaptive colitis 10/12/2009    Past Surgical History:  Procedure Laterality Date  . COLONOSCOPY  07/2011  . HEMORRHOID BANDING  07/2011    Family History        Family Status  Relation Name Status  . Mother  Alive  . Father  Alive  . Sister  Alive  . MGM  Deceased  . Sister  Alive        His family history includes Alcohol abuse in his mother; Colon cancer in his maternal grandmother; Healthy in his sister and sister; Hypertension in his father.      Allergies  Allergen Reactions  . Penicillins      Current Outpatient Medications:  .  Eluxadoline (VIBERZI) 100 MG TABS, Take 1 tablet (100 mg total) by mouth every evening., Disp: 90 tablet, Rfl: 1 .  Eluxadoline (VIBERZI) 75 MG TABS, Take 1 tablet by mouth 2 (two) times daily., Disp: 180 tablet, Rfl: 3 .  ranitidine (ZANTAC) 150 MG tablet, Take 150 mg by mouth 2 (two) times daily., Disp: , Rfl:   Current Facility-Administered Medications:  .  betamethasone acetate-betamethasone sodium phosphate (CELESTONE) injection 3 mg, 3 mg, Intramuscular, Once, Evans, Brent M, DPM .  betamethasone acetate-betamethasone sodium phosphate (CELESTONE)  injection 3 mg, 3 mg, Intramuscular, Once, Felecia ShellingEvans, Brent M, DPM   Patient Care Team: Maple HudsonGilbert, Huda Petrey L Jr., MD as PCP - General (Family Medicine)      Objective:   Vitals: BP 116/76 (BP Location: Left Arm, Patient Position: Sitting, Cuff Size: Normal)   Pulse 72   Temp 97.7 F (36.5 C)   Ht 6\' 3"  (1.905 m)   Wt 216 lb 9.6 oz (98.2 kg)   SpO2 100%   BMI 27.07 kg/m    Vitals:   08/31/18 1000  BP: 116/76  Pulse: 72  Temp: 97.7 F (36.5 C)  SpO2: 100%  Weight: 216 lb 9.6 oz (98.2 kg)  Height: 6\' 3"  (1.905 m)     Physical Exam Constitutional:      Appearance: He is well-developed.  HENT:     Head: Normocephalic and atraumatic.     Right Ear: External ear normal.     Left Ear: External ear normal.     Nose: Nose normal.  Eyes:     General: No scleral icterus.    Conjunctiva/sclera: Conjunctivae normal.     Pupils: Pupils are equal, round, and reactive to light.  Neck:     Musculoskeletal: Neck supple.     Thyroid: No thyromegaly.  Cardiovascular:     Rate and Rhythm: Normal rate and regular rhythm.     Heart sounds: Normal heart sounds.  Pulmonary:     Effort: Pulmonary effort is normal.     Breath sounds: Normal breath sounds.  Abdominal:     Palpations: Abdomen is soft.  Genitourinary:    Penis: Normal.      Prostate: Normal.     Rectum: Normal.  Musculoskeletal: Normal range of motion.  Lymphadenopathy:     Cervical: No cervical adenopathy.  Skin:    General: Skin is warm and dry.     Comments: Fair skin.  Neurological:     Mental Status: He is alert and oriented to person, place, and time.  Psychiatric:        Behavior: Behavior normal.        Thought Content: Thought content normal.        Judgment: Judgment normal.      Depression Screen PHQ 2/9 Scores 08/31/2018 06/06/2017  PHQ - 2 Score 6 6  PHQ- 9 Score 13 11      Assessment & Plan:     Routine Health Maintenance and Physical Exam  Exercise Activities and Dietary  recommendations Goals   None     Immunization History  Administered Date(s) Administered  . Influenza Split 06/18/2006, 06/23/2007, 05/04/2008,  05/21/2011, 08/21/2012  . Influenza,inj,Quad PF,6+ Mos 06/07/2013, 05/22/2016, 06/06/2017, 04/25/2018  . Tdap 05/04/2008    Health Maintenance  Topic Date Due  . HIV Screening  01/12/1986  . TETANUS/TDAP  05/04/2018  . INFLUENZA VACCINE  Completed     Discussed health benefits of physical activity, and encouraged him to engage in regular exercise appropriate for his age and condition.  1. Annual physical exam Patient is start working on exercise habits. - CBC with Differential/Platelet - Comprehensive metabolic panel - Lipid panel - TSH - POCT urinalysis dipstick  2. Need for vaccine for Td (tetanus-diphtheria)  - Td : Tetanus/diphtheria >7yo Preservative  free  3. Recurrent major depressive disorder, in partial remission (HCC) Doing worse recently.  Add Wellbutrin, return to clinic 2 months. - buPROPion (WELLBUTRIN XL) 150 MG 24 hr tablet; Take 1 tablet (150 mg total) by mouth daily.  Dispense: 30 tablet; Refill: 3  4. Irritable bowel syndrome, unspecified type Better with the Viberzi, he is on split dose, due to cost we will try to go to 1 dose and see how he tolerates it. - Eluxadoline (VIBERZI) 100 MG TABS; Take 1 tablet (100 mg total) by mouth 2 (two) times daily.  Dispense: 60 tablet; Refill: 5    I have done the exam and reviewed the above chart and it is accurate to the best of my knowledge. DentistDragon  technology has been used in this note in any air is in the dictation or transcription are unintentional.  Megan Mansichard Dominyk Law Jr, MD  [facility]

## 2018-09-09 MED ORDER — ELUXADOLINE 100 MG PO TABS: 1.0000 | ORAL_TABLET | Freq: Two times a day (BID) | ORAL | 5 refills | Status: DC

## 2018-09-28 DIAGNOSIS — Z0000 Encounter for general adult medical examination without abnormal findings: Secondary | ICD-10-CM | POA: Diagnosis not present

## 2018-09-29 LAB — CBC WITH DIFFERENTIAL/PLATELET
Basophils Absolute: 0.1 10*3/uL (ref 0.0–0.2)
Basos: 1 %
EOS (ABSOLUTE): 0.1 10*3/uL (ref 0.0–0.4)
Eos: 2 %
Hematocrit: 40.3 % (ref 37.5–51.0)
Hemoglobin: 14.3 g/dL (ref 13.0–17.7)
Immature Grans (Abs): 0 10*3/uL (ref 0.0–0.1)
Immature Granulocytes: 0 %
Lymphocytes Absolute: 1.2 10*3/uL (ref 0.7–3.1)
Lymphs: 20 %
MCH: 32.4 pg (ref 26.6–33.0)
MCHC: 35.5 g/dL (ref 31.5–35.7)
MCV: 91 fL (ref 79–97)
Monocytes Absolute: 0.5 10*3/uL (ref 0.1–0.9)
Monocytes: 9 %
Neutrophils Absolute: 4.2 10*3/uL (ref 1.4–7.0)
Neutrophils: 68 %
Platelets: 194 10*3/uL (ref 150–450)
RBC: 4.41 x10E6/uL (ref 4.14–5.80)
RDW: 12.6 % (ref 11.6–15.4)
WBC: 6.2 10*3/uL (ref 3.4–10.8)

## 2018-09-29 LAB — COMPREHENSIVE METABOLIC PANEL
ALT: 11 IU/L (ref 0–44)
AST: 14 IU/L (ref 0–40)
Albumin/Globulin Ratio: 2 (ref 1.2–2.2)
Albumin: 4.7 g/dL (ref 4.0–5.0)
Alkaline Phosphatase: 68 IU/L (ref 39–117)
BUN/Creatinine Ratio: 11 (ref 9–20)
BUN: 13 mg/dL (ref 6–24)
Bilirubin Total: 0.4 mg/dL (ref 0.0–1.2)
CO2: 22 mmol/L (ref 20–29)
Calcium: 9.6 mg/dL (ref 8.7–10.2)
Chloride: 106 mmol/L (ref 96–106)
Creatinine, Ser: 1.17 mg/dL (ref 0.76–1.27)
GFR calc Af Amer: 85 mL/min/{1.73_m2} (ref >59)
GFR calc non Af Amer: 74 mL/min/{1.73_m2} (ref >59)
Globulin, Total: 2.3 g/dL (ref 1.5–4.5)
Glucose: 103 mg/dL — ABNORMAL HIGH (ref 65–99)
Potassium: 4.4 mmol/L (ref 3.5–5.2)
Sodium: 146 mmol/L — ABNORMAL HIGH (ref 134–144)
Total Protein: 7 g/dL (ref 6.0–8.5)

## 2018-09-29 LAB — LIPID PANEL
Chol/HDL Ratio: 3.7 ratio (ref 0.0–5.0)
Cholesterol, Total: 146 mg/dL (ref 100–199)
HDL: 39 mg/dL — ABNORMAL LOW (ref >39)
LDL Calculated: 89 mg/dL (ref 0–99)
Triglycerides: 88 mg/dL (ref 0–149)
VLDL Cholesterol Cal: 18 mg/dL (ref 5–40)

## 2018-09-29 LAB — TSH: TSH: 2.81 u[IU]/mL (ref 0.450–4.500)

## 2018-10-31 ENCOUNTER — Ambulatory Visit (INDEPENDENT_AMBULATORY_CARE_PROVIDER_SITE_OTHER): Payer: 59 | Admitting: Family Medicine

## 2018-10-31 ENCOUNTER — Other Ambulatory Visit: Payer: Self-pay

## 2018-10-31 DIAGNOSIS — F3341 Major depressive disorder, recurrent, in partial remission: Secondary | ICD-10-CM | POA: Diagnosis not present

## 2018-10-31 DIAGNOSIS — K219 Gastro-esophageal reflux disease without esophagitis: Secondary | ICD-10-CM | POA: Diagnosis not present

## 2018-10-31 MED ORDER — BUPROPION HCL ER (XL) 300 MG PO TB24: 300.0000 mg | ORAL_TABLET | Freq: Every day | ORAL | 12 refills | Status: DC

## 2018-10-31 NOTE — Progress Notes (Signed)
Julian Frank  MRN: [id number] DOB: [birth year]  Subjective:  HPI   Patient is presenting today through an E Visit for follow up of his depression.  He was last seen on 08/31/18 for his annual physical.  His PHQ9 score had gone from 11 to 13.  Management changes at that last visit included adding Wellbutrin 150 mg daily.    Patient states he is doing ok on the new medications.  He said that things at home are improving and that helps.  He feels emotionally he is doing better.  He reports having more energy and is accomplishing more work at home.  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  He has been taking his bupropion XL 150 daily  Virtual Visit via Video Note  I connected with Wilborn Mirando on 10/31/18 at 10:00 AM EDT by a video enabled telemedicine application and verified that I am speaking with the correct person using two identifiers.   I discussed the limitations of evaluation and management by telemedicine and the availability of in person appointments. The patient expressed understanding and agreed to proceed.  Patient Active Problem List   Diagnosis Date Noted  . Internal hemorrhoids 10/07/2017  . Plantar fasciitis 05/22/2016  . Clinical depression 10/12/2009  . Hemorrhoids without complication 10/12/2009  . Adaptive colitis 10/12/2009    No past medical history on file.  Social History   Socioeconomic History  . Marital status: Married    Spouse name: Not on file  . Number of children: Not on file  . Years of education: Not on file  . Highest education level: Not on file  Occupational History  . Not on file  Social Needs  . Financial resource strain: Not on file  . Food insecurity:    Worry: Not on file    Inability: Not on file  . Transportation needs:    Medical: Not on file    Non-medical: Not on file  Tobacco Use  . Smoking status: Never Smoker  . Smokeless tobacco: Never Used  Substance and Sexual Activity  . Alcohol use: Yes    Comment:  Occasional  . Drug use: No  . Sexual activity: Not on file  Lifestyle  . Physical activity:    Days per week: Not on file    Minutes per session: Not on file  . Stress: Not on file  Relationships  . Social connections:    Talks on phone: Not on file    Gets together: Not on file    Attends religious service: Not on file    Active member of club or organization: Not on file    Attends meetings of clubs or organizations: Not on file    Relationship status: Not on file  . Intimate partner violence:    Fear of current or ex partner: Not on file    Emotionally abused: Not on file    Physically abused: Not on file    Forced sexual activity: Not on file  Other Topics Concern  . Not on file  Social History Narrative  . Not on file    Outpatient Encounter Medications as of 10/31/2018  Medication Sig  . buPROPion (WELLBUTRIN XL) 150 MG 24 hr tablet Take 1 tablet (150 mg total) by mouth daily.  . Eluxadoline (VIBERZI) 100 MG TABS Take 1 tablet (100 mg total) by mouth 2 (two) times daily.  . ranitidine (ZANTAC) 150 MG tablet Take 150 mg by mouth 2 (two) times daily.  Marland Kitchen  Eluxadoline (VIBERZI) 75 MG TABS Take 1 tablet by mouth 2 (two) times daily. (Patient not taking: Reported on 10/31/2018)   Facility-Administered Encounter Medications as of 10/31/2018  Medication  . betamethasone acetate-betamethasone sodium phosphate (CELESTONE) injection 3 mg  . betamethasone acetate-betamethasone sodium phosphate (CELESTONE) injection 3 mg    Allergies  Allergen Reactions  . Penicillins     Review of Systems  Constitutional: Negative.   Respiratory: Negative.   Cardiovascular: Negative.   Skin: Negative.   Endo/Heme/Allergies: Negative.   Psychiatric/Behavioral: Negative.     Objective:   Physical Exam  Constitutional: He is oriented to person, place, and time and well-developed, well-nourished, and in no distress.  HENT:  Head: Normocephalic and atraumatic.  Eyes: No scleral icterus.   Pulmonary/Chest: Effort normal.  Neurological: He is oriented to person, place, and time.  Psychiatric: Mood, memory, affect and judgment normal.    Assessment and Plan :   1. Recurrent major depressive disorder, in partial remission (HCC) Doing better on Wellbutrin.  Will increase at this time to 300 mg.  He will double what he has now and will call with the pharmacy of choice.  We will see him back in 2 months either with office visit or with virtual visit.  He is in agreement. Follow up in 2 months.  2. Gastroesophageal reflux disease, esophagitis presence not specified Doing well on Zantac     Follow Up Instructions:    I discussed the assessment and treatment plan with the patient. The patient was provided an opportunity to ask questions and all were answered. The patient agreed with the plan and demonstrated an understanding of the instructions.   The patient was advised to call back or seek an in-person evaluation if the symptoms worsen or if the condition fails to improve as anticipated.  I provided 15 minutes of non-face-to-face time during this encounter.          HPI, Exam and A&P Transcribed under the direction and in the presence of Julieanne Manson, Montez Hageman., MD. Electronically Signed: Janey Greaser, RMA I have done the exam and reviewed the chart and it is accurate to the best of my knowledge. Dentist has been used and  any errors in dictation or transcription are unintentional. Julieanne Manson M.D. [facility]

## 2018-11-07 ENCOUNTER — Other Ambulatory Visit: Payer: Self-pay

## 2018-11-07 DIAGNOSIS — F3341 Major depressive disorder, recurrent, in partial remission: Secondary | ICD-10-CM

## 2018-11-07 MED ORDER — BUPROPION HCL ER (XL) 300 MG PO TB24: 300.0000 mg | ORAL_TABLET | Freq: Every day | ORAL | 3 refills | Status: DC

## 2019-01-09 ENCOUNTER — Ambulatory Visit: Payer: Self-pay | Admitting: Family Medicine

## 2019-05-10 ENCOUNTER — Other Ambulatory Visit: Payer: Self-pay | Admitting: Family Medicine

## 2019-05-10 DIAGNOSIS — K589 Irritable bowel syndrome without diarrhea: Secondary | ICD-10-CM

## 2019-05-10 NOTE — Telephone Encounter (Signed)
Pharmacy requesting refills. Please review. Thanks!  

## 2019-06-12 ENCOUNTER — Ambulatory Visit (INDEPENDENT_AMBULATORY_CARE_PROVIDER_SITE_OTHER): Payer: 59

## 2019-06-12 DIAGNOSIS — H524 Presbyopia: Secondary | ICD-10-CM | POA: Diagnosis not present

## 2019-06-12 DIAGNOSIS — H5213 Myopia, bilateral: Secondary | ICD-10-CM | POA: Diagnosis not present

## 2019-06-12 DIAGNOSIS — Z23 Encounter for immunization: Secondary | ICD-10-CM | POA: Diagnosis not present

## 2019-06-12 DIAGNOSIS — H52223 Regular astigmatism, bilateral: Secondary | ICD-10-CM | POA: Diagnosis not present

## 2019-10-18 ENCOUNTER — Ambulatory Visit: Payer: Self-pay | Attending: Internal Medicine

## 2019-10-18 DIAGNOSIS — Z23 Encounter for immunization: Secondary | ICD-10-CM

## 2019-10-18 NOTE — Progress Notes (Signed)
   Covid-19 Vaccination Clinic  Name:  Cassius Cullinane    MRN: [id number] DOB: [birth year]  10/18/2019  Mr. Edmiston was observed post Covid-19 immunization for 15 minutes without incident. He was provided with Vaccine Information Sheet and instruction to access the V-Safe system.   Mr. Lashley was instructed to call 911 with any severe reactions post vaccine: Marland Kitchen Difficulty breathing  . Swelling of face and throat  . A fast heartbeat  . A bad rash all over body  . Dizziness and weakness   Immunizations Administered    Name Date Dose VIS Date Route   Pfizer COVID-19 Vaccine 10/18/2019 10:50 AM 0.3 mL 07/20/2019 Intramuscular   Manufacturer: [employer], [employer]   Lot: [id number]   NDC: [address]-7

## 2019-11-06 ENCOUNTER — Ambulatory Visit: Payer: Self-pay | Attending: Internal Medicine

## 2019-11-06 DIAGNOSIS — Z23 Encounter for immunization: Secondary | ICD-10-CM

## 2019-11-06 NOTE — Progress Notes (Signed)
   Covid-19 Vaccination Clinic  Name:  Julian Frank    MRN: [id number] DOB: 1971/03/26  11/06/2019  Julian Frank was observed post Covid-19 immunization for 15 minutes without incident. He was provided with Vaccine Information Sheet and instruction to access the V-Safe system.   Julian Frank was instructed to call 911 with any severe reactions post vaccine: Marland Kitchen Difficulty breathing  . Swelling of face and throat  . A fast heartbeat  . A bad rash all over body  . Dizziness and weakness   Immunizations Administered    Name Date Dose VIS Date Route   Pfizer COVID-19 Vaccine 11/06/2019 12:21 PM 0.3 mL 07/20/2019 Intramuscular   Manufacturer: [employer], [employer]   Lot: [id number]   NDC: [address]-3

## 2020-01-02 NOTE — Progress Notes (Signed)
Complete physical exam  I,Julian Frank,acting as a scribe for Julian Mans, MD.,have documented all relevant documentation on the behalf of Julian Mans, MD,as directed by  Julian Mans, MD while in the presence of Julian Mans, MD.   Patient: Julian Frank   DOB: [birth year]   48 y.o. Male  MRN: [id number] Visit Date: 01/08/2020  Today's healthcare provider: Megan Mans, MD   Chief Complaint  Patient presents with  . Annual Exam   Subjective    Julian Frank is a 49 y.o. male who presents today for a complete physical exam.  He reports consuming a general diet. The patient does not participate in regular exercise at present. He generally feels well. He reports sleeping well. 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 HPI  Right Knee pain with weight bearing and deep knee bends.  He had trauma to the knee years ago. His IBS is well controlled but is depression is continuing to be an issue.  It is better than in years past but he continues to struggle.  No homicidal or suicidal ideation.  He did stop his Wellbutrin.  History reviewed. No pertinent past medical history. Past Surgical History:  Procedure Laterality Date  . COLONOSCOPY  07/2011  . HEMORRHOID BANDING  07/2011   Social History   Socioeconomic History  . Marital status: Married    Spouse name: Not on file  . Number of children: Not on file  . Years of education: Not on file  . Highest education level: Not on file  Occupational History  . Not on file  Tobacco Use  . Smoking status: Never Smoker  . Smokeless tobacco: Never Used  Substance and Sexual Activity  . Alcohol use: Yes    Comment: Occasional  . Drug use: No  . Sexual activity: Not on file  Other Topics Concern  . Not on file  Social History Narrative  . Not on file   Social Determinants of Health   Financial Resource Strain:   . Difficulty of Paying Living  Expenses:   Food Insecurity:   . Worried About Programme researcher, broadcasting/film/video in the Last Year:   . Barista in the Last Year:   Transportation Needs:   . Freight forwarder (Medical):   Marland Kitchen Lack of Transportation (Non-Medical):   Physical Activity:   . Days of Exercise per Week:   . Minutes of Exercise per Session:   Stress:   . Feeling of Stress :   Social Connections:   . Frequency of Communication with Friends and Family:   . Frequency of Social Gatherings with Friends and Family:   . Attends Religious Services:   . Active Member of Clubs or Organizations:   . Attends Banker Meetings:   Marland Kitchen Marital Status:   Intimate Partner Violence:   . Fear of Current or Ex-Partner:   . Emotionally Abused:   Marland Kitchen Physically Abused:   . Sexually Abused:    Family Status  Relation Name Status  . Mother  Alive  . Father  Alive  . Sister  Alive  . MGM  Deceased  . Sister  Alive   Family History  Problem Relation Age of Onset  . Alcohol abuse Mother   . Hypertension Father   . Healthy Sister   . Colon cancer Maternal Grandmother   . Healthy Sister    Allergies  Allergen Reactions  . Penicillins     Patient Care Team: Jerrol Banana., MD as PCP - General (Family Medicine)   Medications: Outpatient Medications Prior to Visit  Medication Sig  . [DISCONTINUED] Eluxadoline (VIBERZI) 100 MG TABS Take by mouth 2 (two) times daily. Takes once dilly  . Eluxadoline (VIBERZI) 75 MG TABS Take 1 tablet by mouth 2 (two) times daily. (Patient not taking: Reported on 10/31/2018)  . ranitidine (ZANTAC) 150 MG tablet Take 150 mg by mouth 2 (two) times daily.  . [DISCONTINUED] buPROPion (WELLBUTRIN XL) 300 MG 24 hr tablet Take 1 tablet (300 mg total) by mouth daily. (Patient not taking: Reported on 01/08/2020)  . [DISCONTINUED] VIBERZI 100 MG TABS TAKE 1 TABLET BY MOUTH TWO TIMES DAILY (Patient not taking: Reported on 01/08/2020)   Facility-Administered Medications Prior to Visit   Medication Dose Route Frequency Provider  . betamethasone acetate-betamethasone sodium phosphate (CELESTONE) injection 3 mg  3 mg Intramuscular Once Daylene Katayama M, DPM  . betamethasone acetate-betamethasone sodium phosphate (CELESTONE) injection 3 mg  3 mg Intramuscular Once Edrick Kins, DPM    Review of Systems  Constitutional: Negative.   HENT: Negative.   Eyes: Positive for photophobia.  Respiratory: Negative.   Cardiovascular: Negative.   Gastrointestinal: Positive for anal bleeding.  Endocrine: Negative.   Genitourinary: Negative.   Musculoskeletal: Positive for arthralgias.  Allergic/Immunologic: Negative.   Neurological: Negative.   Psychiatric/Behavioral: Positive for dysphoric mood.       Withdrawn.       Objective    BP 116/75 (BP Location: Right Arm, Patient Position: Sitting, Cuff Size: Large)   Pulse 71   Temp (!) 97.5 F (36.4 C) (Other (Comment))   Resp 16   Ht 6\' 3"  (1.905 m)   Wt 217 lb (98.4 kg)   SpO2 97%   BMI 27.12 kg/m  BP Readings from Last 3 Encounters:  01/08/20 116/75  08/31/18 116/76  04/25/18 118/68   Wt Readings from Last 3 Encounters:  01/08/20 217 lb (98.4 kg)  08/31/18 216 lb 9.6 oz (98.2 kg)  04/25/18 211 lb (95.7 kg)      Physical Exam Vitals and nursing note reviewed. Exam conducted with a chaperone present.  Constitutional:      Appearance: Normal appearance. He is normal weight.  HENT:     Right Ear: Tympanic membrane normal.     Left Ear: Tympanic membrane normal.     Nose: Nose normal.     Mouth/Throat:     Mouth: Mucous membranes are moist.  Eyes:     General: No scleral icterus.    Conjunctiva/sclera: Conjunctivae normal.  Cardiovascular:     Rate and Rhythm: Normal rate and regular rhythm.     Pulses: Normal pulses.     Heart sounds: Normal heart sounds.  Pulmonary:     Effort: Pulmonary effort is normal.     Breath sounds: Normal breath sounds.  Abdominal:     General: Bowel sounds are normal.      Palpations: Abdomen is soft.  Genitourinary:    Penis: Normal.      Testes: Normal.  Musculoskeletal:     Cervical back: Normal range of motion and neck supple.  Skin:    General: Skin is warm and dry.  Neurological:     General: No focal deficit present.     Mental Status: He is alert and oriented to person, place, and time.  Psychiatric:        Mood and  Affect: Mood normal.        Behavior: Behavior normal.        Thought Content: Thought content normal.        Judgment: Judgment normal.       Depression Screen  PHQ 2/9 Scores 01/08/2020 08/31/2018 06/06/2017  PHQ - 2 Score 6 6 6   PHQ- 9 Score 9 13 11     Results for orders placed or performed in visit on 01/08/20  POCT urinalysis dipstick  Result Value Ref Range   Color, UA Yellow    Clarity, UA Clear    Glucose, UA Negative Negative   Bilirubin, UA Negative    Ketones, UA Negative    Spec Grav, UA 1.010 1.010 - 1.025   Blood, UA Negative    pH, UA 7.5 5.0 - 8.0   Protein, UA Negative Negative   Urobilinogen, UA 0.2 0.2 or 1.0 E.U./dL   Nitrite, UA Negative    Leukocytes, UA Negative Negative   Appearance Normal    Odor Normal     Assessment & Plan    Routine Health Maintenance and Physical Exam  Exercise Activities and Dietary recommendations Goals   None     Immunization History  Administered Date(s) Administered  . Influenza Split 06/18/2006, 06/23/2007, 05/04/2008, 05/21/2011, 08/21/2012  . Influenza,inj,Quad PF,6+ Mos 06/07/2013, 05/22/2016, 06/06/2017, 04/25/2018, 06/12/2019  . PFIZER SARS-COV-2 Vaccination 10/18/2019, 11/06/2019  . Td 08/31/2018  . Tdap 05/04/2008    Health Maintenance  Topic Date Due  . HIV Screening  Never done  . INFLUENZA VACCINE  03/09/2020  . TETANUS/TDAP  08/31/2028  . COVID-19 Vaccine  Completed    Discussed health benefits of physical activity, and encouraged him to engage in regular exercise appropriate for his age and condition.  1. Annual physical exam  Recommend screening colonoscopy. - TSH - Lipid panel - Comprehensive Metabolic Panel (CMET) - CBC w/Diff/Platelet - POCT urinalysis dipstick-Negative.  2. Recurrent major depressive disorder, in partial remission (HCC) Add Wellbutrin 150.  Return to clinic 2 months. - TSH - Lipid panel - Comprehensive Metabolic Panel (CMET) - CBC w/Diff/Platelet - buPROPion (WELLBUTRIN XL) 150 MG 24 hr tablet; Take 1 tablet (150 mg total) by mouth daily.  Dispense: 90 tablet; Refill: 1  3. AK (actinic keratosis) Refer to dermatology.  AK's on head and forearms - Ambulatory referral to Dermatology  4. Chronic pain of right knee May need orthopedic referral. - celecoxib (CELEBREX) 200 MG capsule; Take 1 capsule (200 mg total) by mouth daily.  Dispense: 90 capsule; Refill: 1  5. Irritable bowel syndrome, unspecified type Well-controlled on Viberzi. - Eluxadoline (VIBERZI) 100 MG TABS; Take 1 tablet (100 mg total) by mouth 2 (two) times daily.  Dispense: 60 tablet; Refill: 5 - Eluxadoline (VIBERZI) 100 MG TABS; Take 1 tablet (100 mg total) by mouth 2 (two) times daily. Takes once dilly  Dispense: 180 tablet; Refill: 1   Return in 3 months (on 04/10/2020).     I, 09/02/2028, MD, have reviewed all documentation for this visit. The documentation on 01/12/20 for the exam, diagnosis, procedures, and orders are all accurate and complete.    Richard Julian Mans, MD  [facility] [phone] (phone) [phone] (fax)  [facility]

## 2020-01-08 ENCOUNTER — Encounter: Payer: Self-pay | Admitting: Family Medicine

## 2020-01-08 ENCOUNTER — Other Ambulatory Visit: Payer: Self-pay

## 2020-01-08 ENCOUNTER — Ambulatory Visit (INDEPENDENT_AMBULATORY_CARE_PROVIDER_SITE_OTHER): Payer: No Typology Code available for payment source | Admitting: Family Medicine

## 2020-01-08 VITALS — BP 116/75 | HR 71 | Temp 97.5°F | Resp 16 | Ht 75.0 in | Wt 217.0 lb

## 2020-01-08 DIAGNOSIS — F3341 Major depressive disorder, recurrent, in partial remission: Secondary | ICD-10-CM

## 2020-01-08 DIAGNOSIS — M25561 Pain in right knee: Secondary | ICD-10-CM | POA: Diagnosis not present

## 2020-01-08 DIAGNOSIS — L570 Actinic keratosis: Secondary | ICD-10-CM

## 2020-01-08 DIAGNOSIS — G8929 Other chronic pain: Secondary | ICD-10-CM

## 2020-01-08 DIAGNOSIS — Z0000 Encounter for general adult medical examination without abnormal findings: Secondary | ICD-10-CM

## 2020-01-08 DIAGNOSIS — K589 Irritable bowel syndrome without diarrhea: Secondary | ICD-10-CM

## 2020-01-08 LAB — POCT URINALYSIS DIPSTICK
Appearance: NORMAL
Bilirubin, UA: NEGATIVE
Blood, UA: NEGATIVE
Glucose, UA: NEGATIVE
Ketones, UA: NEGATIVE
Leukocytes, UA: NEGATIVE
Nitrite, UA: NEGATIVE
Odor: NORMAL
Protein, UA: NEGATIVE
Spec Grav, UA: 1.01 (ref 1.010–1.025)
Urobilinogen, UA: 0.2 E.U./dL
pH, UA: 7.5 (ref 5.0–8.0)

## 2020-01-08 MED ORDER — VIBERZI 100 MG PO TABS: 100.0000 mg | ORAL_TABLET | Freq: Two times a day (BID) | ORAL | 1 refills | Status: DC

## 2020-01-08 MED ORDER — VIBERZI 100 MG PO TABS: 1.0000 | ORAL_TABLET | Freq: Two times a day (BID) | ORAL | 5 refills | Status: DC

## 2020-01-08 MED ORDER — BUPROPION HCL ER (XL) 150 MG PO TB24: 150.0000 mg | ORAL_TABLET | Freq: Every day | ORAL | 1 refills | Status: DC

## 2020-01-08 MED ORDER — CELECOXIB 200 MG PO CAPS: 200.0000 mg | ORAL_CAPSULE | Freq: Every day | ORAL | 1 refills | Status: DC

## 2020-01-09 LAB — COMPREHENSIVE METABOLIC PANEL
ALT: 8 IU/L (ref 0–44)
AST: 18 IU/L (ref 0–40)
Albumin/Globulin Ratio: 1.7 (ref 1.2–2.2)
Albumin: 4.6 g/dL (ref 4.0–5.0)
Alkaline Phosphatase: 73 IU/L (ref 48–121)
BUN/Creatinine Ratio: 7 — ABNORMAL LOW (ref 9–20)
BUN: 8 mg/dL (ref 6–24)
Bilirubin Total: 0.5 mg/dL (ref 0.0–1.2)
CO2: 24 mmol/L (ref 20–29)
Calcium: 9.8 mg/dL (ref 8.7–10.2)
Chloride: 102 mmol/L (ref 96–106)
Creatinine, Ser: 1.2 mg/dL (ref 0.76–1.27)
GFR calc Af Amer: 82 mL/min/{1.73_m2} (ref >59)
GFR calc non Af Amer: 71 mL/min/{1.73_m2} (ref >59)
Globulin, Total: 2.7 g/dL (ref 1.5–4.5)
Glucose: 83 mg/dL (ref 65–99)
Potassium: 3.9 mmol/L (ref 3.5–5.2)
Sodium: 142 mmol/L (ref 134–144)
Total Protein: 7.3 g/dL (ref 6.0–8.5)

## 2020-01-09 LAB — CBC WITH DIFFERENTIAL/PLATELET
Basophils Absolute: 0.1 10*3/uL (ref 0.0–0.2)
Basos: 1 %
EOS (ABSOLUTE): 0.1 10*3/uL (ref 0.0–0.4)
Eos: 1 %
Hematocrit: 41.2 % (ref 37.5–51.0)
Hemoglobin: 14.1 g/dL (ref 13.0–17.7)
Immature Grans (Abs): 0 10*3/uL (ref 0.0–0.1)
Immature Granulocytes: 0 %
Lymphocytes Absolute: 1.6 10*3/uL (ref 0.7–3.1)
Lymphs: 19 %
MCH: 31.7 pg (ref 26.6–33.0)
MCHC: 34.2 g/dL (ref 31.5–35.7)
MCV: 93 fL (ref 79–97)
Monocytes Absolute: 0.7 10*3/uL (ref 0.1–0.9)
Monocytes: 8 %
Neutrophils Absolute: 6.2 10*3/uL (ref 1.4–7.0)
Neutrophils: 71 %
Platelets: 231 10*3/uL (ref 150–450)
RBC: 4.45 x10E6/uL (ref 4.14–5.80)
RDW: 12.6 % (ref 11.6–15.4)
WBC: 8.6 10*3/uL (ref 3.4–10.8)

## 2020-01-09 LAB — LIPID PANEL
Chol/HDL Ratio: 3.3 ratio (ref 0.0–5.0)
Cholesterol, Total: 138 mg/dL (ref 100–199)
HDL: 42 mg/dL (ref >39)
LDL Chol Calc (NIH): 82 mg/dL (ref 0–99)
Triglycerides: 72 mg/dL (ref 0–149)
VLDL Cholesterol Cal: 14 mg/dL (ref 5–40)

## 2020-01-09 LAB — TSH: TSH: 2.7 u[IU]/mL (ref 0.450–4.500)

## 2020-02-12 ENCOUNTER — Encounter: Payer: Self-pay | Admitting: Family Medicine

## 2020-04-03 ENCOUNTER — Ambulatory Visit: Payer: Self-pay | Admitting: Dermatology

## 2020-04-04 NOTE — Progress Notes (Signed)
Inetta Fermo Cummings,acting as a scribe for Megan Mans, MD.,have documented all relevant documentation on the behalf of Megan Mans, MD,as directed by  Megan Mans, MD while in the presence of Megan Mans, MD.  Established patient visit   Patient: Julian Frank   DOB: 1971/01/18   49 y.o. Male  MRN: [id number] Visit Date: 04/09/2020  Today's healthcare provider: Megan Mans, MD   Chief Complaint  Patient presents with  . Depression   Subjective    HPI  Depression, Follow-up  He  was last seen for this 3 months ago. Changes made at last visit include; Add Wellbutrin 150.  Return to clinic 2 months.   He reports excellent compliance with treatment. He is not having side effects.   He reports excellent tolerance of treatment. Current symptoms include: none He feels he is Improved since last visit.  Patient is now taking 300mg  of Wellbutrin.  He is feeling some better.  His IBS is well controlled as long as he takes a Viberzi  Depression screen [facility] 2/9 01/08/2020 08/31/2018 06/06/2017  Decreased Interest 3 3 3   Down, Depressed, Hopeless 3 3 3   PHQ - 2 Score 6 6 6   Altered sleeping 0 1 0  Tired, decreased energy 2 3 2   Change in appetite 0 0 0  Feeling bad or failure about yourself  1 3 2   Trouble concentrating 0 0 1  Moving slowly or fidgety/restless 0 0 0  Suicidal thoughts 0 0 0  PHQ-9 Score 9 13 11   Difficult doing work/chores Somewhat difficult Extremely dIfficult Not difficult at all    -----------------------------------------------------------------------------------------  Social History   Tobacco Use  . Smoking status: Never Smoker  . Smokeless tobacco: Never Used  Vaping Use  . Vaping Use: Never used  Substance Use Topics  . Alcohol use: Yes    Comment: Occasional  . Drug use: No       Medications: Outpatient Medications Prior to Visit  Medication Sig  . buPROPion (WELLBUTRIN XL) 150 MG 24 hr tablet Take 1  tablet (150 mg total) by mouth daily.  . Eluxadoline (VIBERZI) 100 MG TABS Take 1 tablet (100 mg total) by mouth 2 (two) times daily.  . celecoxib (CELEBREX) 200 MG capsule Take 1 capsule (200 mg total) by mouth daily.  . Eluxadoline (VIBERZI) 75 MG TABS Take 1 tablet by mouth 2 (two) times daily. (Patient not taking: Reported on 10/31/2018)  . ranitidine (ZANTAC) 150 MG tablet Take 150 mg by mouth 2 (two) times daily.  . [DISCONTINUED] Eluxadoline (VIBERZI) 100 MG TABS Take 1 tablet (100 mg total) by mouth 2 (two) times daily. Takes once dilly   Facility-Administered Medications Prior to Visit  Medication Dose Route Frequency Provider  . betamethasone acetate-betamethasone sodium phosphate (CELESTONE) injection 3 mg  3 mg Intramuscular Once M, DPM  . betamethasone acetate-betamethasone sodium phosphate (CELESTONE) injection 3 mg  3 mg Intramuscular Once , DPM    Review of Systems  Constitutional: Negative for appetite change, chills and fever.  Respiratory: Negative for chest tightness, shortness of breath and wheezing.   Cardiovascular: Negative for chest pain and palpitations.  Gastrointestinal: Negative for abdominal pain, nausea and vomiting.       Objective    BP 110/77 (BP Location: Right Arm, Patient Position: Sitting, Cuff Size: Large)   Pulse 74   Temp 98.2 F (36.8 C) (Oral)   Ht 6\' 3"  (1.905 m)   Wt  215 lb 3.2 oz (97.6 kg)   BMI 26.90 kg/m  BP Readings from Last 3 Encounters:  04/09/20 110/77  01/08/20 116/75  08/31/18 116/76   Wt Readings from Last 3 Encounters:  04/09/20 215 lb 3.2 oz (97.6 kg)  01/08/20 217 lb (98.4 kg)  08/31/18 216 lb 9.6 oz (98.2 kg)      Physical Exam Vitals reviewed.  Constitutional:      Appearance: Normal appearance.  HENT:     Head: Normocephalic and atraumatic.     Right Ear: External ear normal.     Left Ear: External ear normal.  Eyes:     General: No scleral icterus.    Conjunctiva/sclera:  Conjunctivae normal.  Cardiovascular:     Rate and Rhythm: Normal rate and regular rhythm.     Pulses: Normal pulses.     Heart sounds: Normal heart sounds.  Pulmonary:     Effort: Pulmonary effort is normal.     Breath sounds: Normal breath sounds.  Musculoskeletal:     Right lower leg: No edema.     Left lower leg: No edema.  Skin:    General: Skin is warm and dry.  Neurological:     General: No focal deficit present.     Mental Status: He is alert and oriented to person, place, and time.  Psychiatric:        Mood and Affect: Mood normal.        Behavior: Behavior normal.        Thought Content: Thought content normal.        Judgment: Judgment normal.       No results found for any visits on 04/09/20.  Assessment & Plan     1. Recurrent major depressive disorder, in partial remission (HCC) We will consider going to bupropion 450 daily but that will be maximum dose. - buPROPion (WELLBUTRIN XL) 300 MG 24 hr tablet; Take 1 tablet (300 mg total) by mouth daily.  Dispense: 90 tablet; Refill: 1  2. Irritable bowel syndrome with diarrhea Controlled on Viberzi.  Follow-up 6 months   No follow-ups on file.      I, Megan Mans, MD, have reviewed all documentation for this visit. The documentation on 04/21/20 for the exam, diagnosis, procedures, and orders are all accurate and complete.    Julian Lant Wendelyn Breslow, MD  [facility] [phone] (phone) [phone] (fax)  [facility]

## 2020-04-09 ENCOUNTER — Ambulatory Visit (INDEPENDENT_AMBULATORY_CARE_PROVIDER_SITE_OTHER): Payer: No Typology Code available for payment source | Admitting: Family Medicine

## 2020-04-09 ENCOUNTER — Other Ambulatory Visit: Payer: Self-pay | Admitting: Family Medicine

## 2020-04-09 ENCOUNTER — Other Ambulatory Visit: Payer: Self-pay

## 2020-04-09 ENCOUNTER — Encounter: Payer: Self-pay | Admitting: Family Medicine

## 2020-04-09 VITALS — BP 110/77 | HR 74 | Temp 98.2°F | Ht 75.0 in | Wt 215.2 lb

## 2020-04-09 DIAGNOSIS — F3341 Major depressive disorder, recurrent, in partial remission: Secondary | ICD-10-CM

## 2020-04-09 DIAGNOSIS — K580 Irritable bowel syndrome with diarrhea: Secondary | ICD-10-CM

## 2020-04-09 MED ORDER — BUPROPION HCL ER (XL) 300 MG PO TB24
300.0000 mg | ORAL_TABLET | Freq: Every day | ORAL | 1 refills | Status: DC
Start: 2020-04-09 — End: 2020-04-09

## 2020-09-03 ENCOUNTER — Ambulatory Visit: Payer: Self-pay | Admitting: Family Medicine

## 2020-11-24 ENCOUNTER — Other Ambulatory Visit: Payer: Self-pay

## 2021-02-02 ENCOUNTER — Ambulatory Visit: Payer: Self-pay | Admitting: Family Medicine

## 2021-03-19 ENCOUNTER — Other Ambulatory Visit: Payer: Self-pay

## 2021-04-22 ENCOUNTER — Other Ambulatory Visit: Payer: Self-pay

## 2021-04-22 ENCOUNTER — Ambulatory Visit (INDEPENDENT_AMBULATORY_CARE_PROVIDER_SITE_OTHER): Payer: No Typology Code available for payment source | Admitting: Family Medicine

## 2021-04-22 ENCOUNTER — Encounter: Payer: Self-pay | Admitting: Family Medicine

## 2021-04-22 VITALS — BP 116/77 | HR 83 | Temp 98.7°F | Resp 16 | Ht 75.0 in | Wt 221.0 lb

## 2021-04-22 DIAGNOSIS — K580 Irritable bowel syndrome with diarrhea: Secondary | ICD-10-CM

## 2021-04-22 DIAGNOSIS — Z0000 Encounter for general adult medical examination without abnormal findings: Secondary | ICD-10-CM

## 2021-04-22 DIAGNOSIS — F3341 Major depressive disorder, recurrent, in partial remission: Secondary | ICD-10-CM

## 2021-04-22 DIAGNOSIS — Z23 Encounter for immunization: Secondary | ICD-10-CM

## 2021-04-22 NOTE — Progress Notes (Signed)
Complete physical exam   Patient: Julian Frank   DOB: Nov 25, 1970   50 y.o. Male  MRN: [id number] Visit Date: 04/22/2021  Today's healthcare provider: Megan Mans, MD   Chief Complaint  Patient presents with   Annual Exam   Subjective    Julian Frank is a 50 y.o. male who presents today for a complete physical exam.  He reports consuming a general diet. The patient has a physically strenuous job, but has no regular exercise apart from work.  He generally feels well. He reports sleeping well . He does not have additional problems to discuss today. Feels well.  He has no complaints. No past medical history on file. Past Surgical History:  Procedure Laterality Date   COLONOSCOPY  07/2011   HEMORRHOID BANDING  07/2011   Social History   Socioeconomic History   Marital status: Married    Spouse name: Not on file   Number of children: Not on file   Years of education: Not on file   Highest education level: Not on file  Occupational History   Not on file  Tobacco Use   Smoking status: Never   Smokeless tobacco: Never  Vaping Use   Vaping Use: Never used  Substance and Sexual Activity   Alcohol use: Yes    Comment: Occasional   Drug use: No   Sexual activity: Not on file  Other Topics Concern   Not on file  Social History Narrative   Not on file   Social Determinants of Health   Financial Resource Strain: Not on file  Food Insecurity: Not on file  Transportation Needs: Not on file  Physical Activity: Not on file  Stress: Not on file  Social Connections: Not on file  Intimate Partner Violence: Not on file   Family Status  Relation Name Status   Mother  Alive   Father  Alive   Sister  Alive   MGM  Deceased   Sister  Alive   Family History  Problem Relation Age of Onset   Alcohol abuse Mother    Hypertension Father    Healthy Sister    Colon cancer Maternal Grandmother    Healthy Sister    Allergies  Allergen Reactions   Penicillins      Patient Care Team: Maple Hudson., MD as PCP - General (Family Medicine)   Medications: Outpatient Medications Prior to Visit  Medication Sig   buPROPion (WELLBUTRIN XL) 300 MG 24 hr tablet TAKE 1 TABLET BY MOUTH DAILY   celecoxib (CELEBREX) 200 MG capsule Take 1 capsule (200 mg total) by mouth daily.   Eluxadoline (VIBERZI) 100 MG TABS Take 1 tablet (100 mg total) by mouth 2 (two) times daily.   Eluxadoline (VIBERZI) 75 MG TABS Take 1 tablet by mouth 2 (two) times daily. (Patient not taking: Reported on 10/31/2018)   ranitidine (ZANTAC) 150 MG tablet Take 150 mg by mouth 2 (two) times daily.   Facility-Administered Medications Prior to Visit  Medication Dose Route Frequency Provider   betamethasone acetate-betamethasone sodium phosphate (CELESTONE) injection 3 mg  3 mg Intramuscular Once Gala Lewandowsky M, DPM   betamethasone acetate-betamethasone sodium phosphate (CELESTONE) injection 3 mg  3 mg Intramuscular Once Felecia Shelling, DPM    Review of Systems  All other systems reviewed and are negative.     Objective    BP 116/77   Pulse 83   Temp 98.7 F (37.1 C)   Resp 16  Ht 6\' 3"  (1.905 m)   Wt 221 lb (100.2 kg)   BMI 27.62 kg/m  BP Readings from Last 3 Encounters:  04/22/21 116/77  04/09/20 110/77  01/08/20 116/75   Wt Readings from Last 3 Encounters:  04/22/21 221 lb (100.2 kg)  04/09/20 215 lb 3.2 oz (97.6 kg)  01/08/20 217 lb (98.4 kg)      Physical Exam Vitals and nursing note reviewed.  Constitutional:      Appearance: Normal appearance. He is normal weight.  HENT:     Right Ear: Tympanic membrane normal.     Left Ear: Tympanic membrane normal.     Nose: Nose normal.     Mouth/Throat:     Mouth: Mucous membranes are moist.  Eyes:     General: No scleral icterus.    Conjunctiva/sclera: Conjunctivae normal.  Cardiovascular:     Rate and Rhythm: Normal rate and regular rhythm.     Pulses: Normal pulses.     Heart sounds: Normal heart sounds.   Pulmonary:     Effort: Pulmonary effort is normal.     Breath sounds: Normal breath sounds.  Abdominal:     General: Bowel sounds are normal.     Palpations: Abdomen is soft.  Genitourinary:    Penis: Normal.      Testes: Normal.  Musculoskeletal:     Cervical back: Normal range of motion and neck supple.  Skin:    General: Skin is warm and dry.     Comments: Fair skin.  Neurological:     General: No focal deficit present.     Mental Status: He is alert and oriented to person, place, and time.  Psychiatric:        Mood and Affect: Mood normal.        Behavior: Behavior normal.        Thought Content: Thought content normal.        Judgment: Judgment normal.      Last depression screening scores PHQ 2/9 Scores 04/22/2021 04/09/2020 01/08/2020  PHQ - 2 Score 5 4 6   PHQ- 9 Score 10 6 9    Last fall risk screening Fall Risk  04/22/2021  Falls in the past year? 0  Number falls in past yr: 0  Injury with Fall? 0  Risk for fall due to : No Fall Risks  Follow up Falls evaluation completed   Last Audit-C alcohol use screening Alcohol Use Disorder Test (AUDIT) 04/22/2021  1. How often do you have a drink containing alcohol? 3  2. How many drinks containing alcohol do you have on a typical day when you are drinking? 0  3. How often do you have six or more drinks on one occasion? 0  AUDIT-C Score 3  4. How often during the last year have you found that you were not able to stop drinking once you had started? 0  5. How often during the last year have you failed to do what was normally expected from you because of drinking? 0  6. How often during the last year have you needed a first drink in the morning to get yourself going after a heavy drinking session? 0  7. How often during the last year have you had a feeling of guilt of remorse after drinking? 0  8. How often during the last year have you been unable to remember what happened the night before because you had been drinking? 0  9.  Have you or someone else been  injured as a result of your drinking? 0  10. Has a relative or friend or a doctor or another health worker been concerned about your drinking or suggested you cut down? 0  Alcohol Use Disorder Identification Test Final Score (AUDIT) 3  Alcohol Brief Interventions/Follow-up -   A score of 3 or more in women, and 4 or more in men indicates increased risk for alcohol abuse, EXCEPT if all of the points are from question 1   No results found for any visits on 04/22/21.  Assessment & Plan    Routine Health Maintenance and Physical Exam  Exercise Activities and Dietary recommendations  Goals   None     Immunization History  Administered Date(s) Administered   Influenza Split 06/18/2006, 06/23/2007, 05/04/2008, 05/21/2011, 08/21/2012   Influenza,inj,Quad PF,6+ Mos 06/07/2013, 05/22/2016, 06/06/2017, 04/25/2018, 06/12/2019   PFIZER(Purple Top)SARS-COV-2 Vaccination 10/18/2019, 11/06/2019   Td 08/31/2018   Tdap 05/04/2008    Health Maintenance  Topic Date Due   Pneumococcal Vaccine 23-41 Years old (1 - PCV) Never done   HIV Screening  Never done   Hepatitis C Screening  Never done   Zoster Vaccines- Shingrix (1 of 2) Never done   COVID-19 Vaccine (3 - Pfizer risk series) 12/04/2019   INFLUENZA VACCINE  03/09/2021   COLONOSCOPY (Pts 45-57yrs Insurance coverage will need to be confirmed)  07/15/2021   TETANUS/TDAP  08/31/2028   HPV VACCINES  Aged Out    Discussed health benefits of physical activity, and encouraged him to engage in regular exercise appropriate for his age and condition.  1. Annual physical exam  - Lipid panel - TSH - CBC w/Diff/Platelet - Comprehensive Metabolic Panel (CMET) - PSA  2. Recurrent major depressive disorder, in partial remission (HCC) On Wellbutrin.  3. Need for immunization against influenza  - Flu Vaccine QUAD 81mo+IM (Fluarix, Fluzone & Alfiuria Quad PF)  4. Need for shingles vaccine  - Varicella-zoster  vaccine IM (Shingrix) 5.IBS Well controlled on viberzi  No follow-ups on file.     I, Megan Mans, MD, have reviewed all documentation for this visit. The documentation on 04/26/21 for the exam, diagnosis, procedures, and orders are all accurate and complete.    Sedale Jenifer Wendelyn Breslow, MD  [facility] [phone] (phone) [phone] (fax)  [facility]

## 2021-04-23 DIAGNOSIS — Z23 Encounter for immunization: Secondary | ICD-10-CM | POA: Diagnosis not present

## 2021-06-24 ENCOUNTER — Other Ambulatory Visit: Payer: Self-pay

## 2021-06-24 ENCOUNTER — Ambulatory Visit (INDEPENDENT_AMBULATORY_CARE_PROVIDER_SITE_OTHER): Payer: No Typology Code available for payment source | Admitting: Family Medicine

## 2021-06-24 DIAGNOSIS — Z23 Encounter for immunization: Secondary | ICD-10-CM

## 2021-06-29 NOTE — Progress Notes (Signed)
Vaccine only

## 2021-07-14 ENCOUNTER — Other Ambulatory Visit: Payer: Self-pay | Admitting: Family Medicine

## 2021-07-14 ENCOUNTER — Other Ambulatory Visit: Payer: Self-pay

## 2021-07-14 DIAGNOSIS — K589 Irritable bowel syndrome without diarrhea: Secondary | ICD-10-CM

## 2021-07-14 MED ORDER — VIBERZI 100 MG PO TABS: 1.0000 | ORAL_TABLET | Freq: Two times a day (BID) | ORAL | 1 refills | Status: DC

## 2021-07-16 ENCOUNTER — Other Ambulatory Visit: Payer: Self-pay

## 2021-07-16 ENCOUNTER — Other Ambulatory Visit: Payer: Self-pay | Admitting: Family Medicine

## 2021-07-16 DIAGNOSIS — K589 Irritable bowel syndrome without diarrhea: Secondary | ICD-10-CM

## 2021-07-16 MED ORDER — VIBERZI 100 MG PO TABS
1.0000 | ORAL_TABLET | Freq: Two times a day (BID) | ORAL | 1 refills | Status: AC
  Filled 2021-07-16 – 2021-09-01 (×6): qty 180, 90d supply, fill #0

## 2021-07-17 ENCOUNTER — Other Ambulatory Visit: Payer: Self-pay

## 2021-07-29 ENCOUNTER — Other Ambulatory Visit: Payer: Self-pay

## 2021-08-18 ENCOUNTER — Other Ambulatory Visit: Payer: Self-pay

## 2021-08-18 ENCOUNTER — Telehealth: Payer: Self-pay

## 2021-08-18 NOTE — Telephone Encounter (Signed)
Patient requested a refill for Trellis Moment a month ago.  Needs prior author.    Wife said they havent heard a word about this.  Needs meds ASAP.

## 2021-08-19 NOTE — Telephone Encounter (Signed)
PA started.  Waiting for response

## 2021-08-27 NOTE — Telephone Encounter (Addendum)
Received form from patient's insurance requesting more information. Form was completed and faxed back.

## 2021-08-28 ENCOUNTER — Other Ambulatory Visit: Payer: Self-pay

## 2021-09-01 ENCOUNTER — Other Ambulatory Visit: Payer: Self-pay

## 2021-09-02 ENCOUNTER — Other Ambulatory Visit: Payer: Self-pay

## 2021-09-10 ENCOUNTER — Other Ambulatory Visit: Payer: Self-pay

## 2021-10-07 ENCOUNTER — Other Ambulatory Visit (HOSPITAL_COMMUNITY): Payer: Self-pay

## 2021-12-01 ENCOUNTER — Other Ambulatory Visit (HOSPITAL_COMMUNITY): Payer: Self-pay

## 2021-12-11 ENCOUNTER — Other Ambulatory Visit: Payer: Self-pay

## 2022-04-28 ENCOUNTER — Encounter: Payer: No Typology Code available for payment source | Admitting: Family Medicine

## 2023-03-16 ENCOUNTER — Encounter: Payer: Self-pay | Admitting: Family Medicine

## 2023-03-16 ENCOUNTER — Ambulatory Visit (INDEPENDENT_AMBULATORY_CARE_PROVIDER_SITE_OTHER): Payer: 59 | Admitting: Family Medicine

## 2023-03-16 VITALS — BP 115/75 | HR 74 | Ht 75.0 in | Wt 230.0 lb

## 2023-03-16 DIAGNOSIS — K582 Mixed irritable bowel syndrome: Secondary | ICD-10-CM

## 2023-03-16 DIAGNOSIS — Z0000 Encounter for general adult medical examination without abnormal findings: Secondary | ICD-10-CM

## 2023-03-16 DIAGNOSIS — Z114 Encounter for screening for human immunodeficiency virus [HIV]: Secondary | ICD-10-CM | POA: Insufficient documentation

## 2023-03-16 DIAGNOSIS — Z1211 Encounter for screening for malignant neoplasm of colon: Secondary | ICD-10-CM

## 2023-03-16 DIAGNOSIS — Z1159 Encounter for screening for other viral diseases: Secondary | ICD-10-CM | POA: Diagnosis not present

## 2023-03-16 MED ORDER — VIBERZI 100 MG PO TABS: 100.0000 mg | ORAL_TABLET | Freq: Two times a day (BID) | ORAL | Status: DC

## 2023-03-16 NOTE — Assessment & Plan Note (Signed)
Low risk screen ?Consented; encouraged to "know your status" ?Recommend repeat screen if risk factors change ? ?

## 2023-03-16 NOTE — Assessment & Plan Note (Signed)
Low risk screen Treatable, and curable. If left untreated Hep C can lead to cirrhosis and liver failure. Encourage routine testing; recommend repeat testing if risk factors change.  

## 2023-03-16 NOTE — Assessment & Plan Note (Signed)
Reports flares every 6 weeks; due for colon cancer screening

## 2023-03-16 NOTE — Assessment & Plan Note (Signed)
UTD on dental and vision Denies concerns PHQ 9 remains elevated; hx of depression Pt reports work has been challenging in this economy Things to do to keep yourself healthy  - Exercise at least 30-45 minutes a day, 3-4 days a week.  - Eat a low-fat diet with lots of fruits and vegetables, up to 7-9 servings per day.  - Seatbelts can save your life. Wear them always.  - Smoke detectors on every level of your home, check batteries every year.  - Eye Doctor - have an eye exam every 1-2 years  - Safe sex - if you may be exposed to STDs, use a condom.  - Alcohol -  If you drink, do it moderately, less than 2 drinks per day.  - Health Care Power of Attorney. Choose someone to speak for you if you are not able.  - Depression is common in our stressful world.If you're feeling down or losing interest in things you normally enjoy, please come in for a visit.  - Violence - If anyone is threatening or hurting you, please call immediately. Due for labs Reports he takes viberzi 100 mg BID; gets samples from another office. Will add to Rx list today.

## 2023-03-16 NOTE — Assessment & Plan Note (Signed)
Due for screening; last reviewed in 2012

## 2023-03-16 NOTE — Progress Notes (Signed)
Complete physical exam   Patient: Julian Frank   DOB: 1971-03-01   52 y.o. Male  MRN: [id number] Visit Date: 03/16/2023  Today's healthcare provider: Jacky Kindle, FNP  Introduced to nurse practitioner role and practice setting.  All questions answered.  Discussed provider/patient relationship and expectations.  Chief Complaint  Patient presents with   Annual Exam   Subjective    Julian Frank is a 52 y.o. male who presents today for a complete physical exam.  He reports consuming a general diet. The patient has a physically strenuous job, but has no regular exercise apart from work.  He generally feels well. He reports sleeping well. He does not have additional problems to discuss today.  HPI   Previously a patient of Dr Sullivan Lone; last CPE 04/2021.  History reviewed. No pertinent past medical history. Past Surgical History:  Procedure Laterality Date   COLONOSCOPY  07/2011   HEMORRHOID BANDING  07/2011   Social History   Socioeconomic History   Marital status: Married    Spouse name: Not on file   Number of children: Not on file   Years of education: Not on file   Highest education level: Bachelor's degree (e.g., BA, AB, BS)  Occupational History   Not on file  Tobacco Use   Smoking status: Never   Smokeless tobacco: Never  Vaping Use   Vaping status: Never Used  Substance and Sexual Activity   Alcohol use: Yes    Comment: Occasional   Drug use: No   Sexual activity: Not on file  Other Topics Concern   Not on file  Social History Narrative   Not on file   Social Determinants of Health   Financial Resource Strain: Low Risk  (03/15/2023)   Overall Financial Resource Strain (CARDIA)    Difficulty of Paying Living Expenses: Not hard at all  Food Insecurity: No Food Insecurity (03/15/2023)   Hunger Vital Sign    Worried About Running Out of Food in the Last Year: Never true    Ran Out of Food in the Last Year: Never true  Transportation Needs: No  Transportation Needs (03/15/2023)   PRAPARE - Administrator, Civil Service (Medical): No    Lack of Transportation (Non-Medical): No  Physical Activity: Unknown (03/15/2023)   Exercise Vital Sign    Days of Exercise per Week: 0 days    Minutes of Exercise per Session: Not on file  Stress: Stress Concern Present (03/15/2023)   [employer] of Occupational Health - Occupational Stress Questionnaire    Feeling of Stress : To some extent  Social Connections: Moderately Isolated (03/15/2023)   Social Connection and Isolation Panel [NHANES]    Frequency of Communication with Friends and Family: Once a week    Frequency of Social Gatherings with Friends and Family: Once a week    Attends Religious Services: 1 to 4 times per year    Active Member of [employer] or Organizations: No    Attends Engineer, structural: Not on file    Marital Status: Married  Catering manager Violence: Not on file   Family Status  Relation Name Status   Mother  Alive   Father  Alive   Sister  Alive   MGM  Deceased   Sister  Alive  No partnership data on file   Family History  Problem Relation Age of Onset   Alcohol abuse Mother    Hypertension Father    Healthy Sister  Colon cancer Maternal Grandmother    Healthy Sister    Allergies  Allergen Reactions   Penicillins     Patient Care Team: Bosie Clos, MD as PCP - General (Family Medicine)   Medications: Outpatient Medications Prior to Visit  Medication Sig   [DISCONTINUED] buPROPion (WELLBUTRIN XL) 300 MG 24 hr tablet TAKE 1 TABLET BY MOUTH DAILY   [DISCONTINUED] celecoxib (CELEBREX) 200 MG capsule Take 1 capsule (200 mg total) by mouth daily.   [DISCONTINUED] ranitidine (ZANTAC) 150 MG tablet Take 150 mg by mouth 2 (two) times daily.   [DISCONTINUED] betamethasone acetate-betamethasone sodium phosphate (CELESTONE) injection 3 mg    [DISCONTINUED] betamethasone acetate-betamethasone sodium phosphate (CELESTONE) injection 3  mg    No facility-administered medications prior to visit.    Objective    BP 115/75 (BP Location: Right Arm, Patient Position: Sitting, Cuff Size: Normal)   Pulse 74   Ht 6\' 3"  (1.905 m)   Wt 230 lb (104.3 kg)   SpO2 99%   BMI 28.75 kg/m   Physical Exam Vitals and nursing note reviewed.  Constitutional:      General: He is awake. He is not in acute distress.    Appearance: Normal appearance. He is well-developed, well-groomed and overweight. He is not ill-appearing, toxic-appearing or diaphoretic.  HENT:     Head: Normocephalic and atraumatic.     Jaw: There is normal jaw occlusion. No trismus, tenderness, swelling or pain on movement.     Salivary Glands: Right salivary gland is not diffusely enlarged or tender. Left salivary gland is not diffusely enlarged or tender.     Right Ear: Hearing, tympanic membrane, ear canal and external ear normal. There is no impacted cerumen.     Left Ear: Hearing, tympanic membrane, ear canal and external ear normal. There is no impacted cerumen.     Nose: Nose normal. No congestion or rhinorrhea.     Right Turbinates: Not enlarged, swollen or pale.     Left Turbinates: Not enlarged, swollen or pale.     Right Sinus: No maxillary sinus tenderness or frontal sinus tenderness.     Left Sinus: No maxillary sinus tenderness or frontal sinus tenderness.     Mouth/Throat:     Lips: Pink.     Mouth: Mucous membranes are moist. No injury, lacerations, oral lesions or angioedema.     Pharynx: Oropharynx is clear. Uvula midline. No pharyngeal swelling, oropharyngeal exudate or posterior oropharyngeal erythema.     Tonsils: No tonsillar exudate or tonsillar abscesses.  Eyes:     General: Lids are normal. Vision grossly intact. Gaze aligned appropriately.        Right eye: No discharge.        Left eye: No discharge.     Extraocular Movements: Extraocular movements intact.     Conjunctiva/sclera: Conjunctivae normal.     Pupils: Pupils are equal, round,  and reactive to light.  Neck:     Thyroid: No thyroid mass, thyromegaly or thyroid tenderness.     Vascular: No carotid bruit.     Trachea: Trachea normal. No tracheal tenderness.  Cardiovascular:     Rate and Rhythm: Normal rate and regular rhythm.     Pulses: Normal pulses.          Carotid pulses are 2+ on the right side and 2+ on the left side.      Radial pulses are 2+ on the right side and 2+ on the left side.  Femoral pulses are 2+ on the right side and 2+ on the left side.      Popliteal pulses are 2+ on the right side and 2+ on the left side.       Dorsalis pedis pulses are 2+ on the right side and 2+ on the left side.       Posterior tibial pulses are 2+ on the right side and 2+ on the left side.     Heart sounds: Normal heart sounds, S1 normal and S2 normal. No murmur heard.    No friction rub. No gallop.  Pulmonary:     Effort: Pulmonary effort is normal. No respiratory distress.     Breath sounds: Normal breath sounds and air entry. No stridor. No wheezing, rhonchi or rales.  Chest:     Chest wall: No tenderness.  Abdominal:     General: Abdomen is flat. Bowel sounds are normal. There is no distension.     Palpations: Abdomen is soft. There is no mass.     Tenderness: There is no abdominal tenderness. There is no guarding or rebound.     Hernia: No hernia is present.  Genitourinary:    Comments: Exam deferred; denies complaints Musculoskeletal:        General: No swelling, tenderness, deformity or signs of injury. Normal range of motion.     Cervical back: Normal range of motion and neck supple. No rigidity or tenderness.     Right lower leg: No edema.     Left lower leg: No edema.  Lymphadenopathy:     Cervical: No cervical adenopathy.     Right cervical: No superficial, deep or posterior cervical adenopathy.    Left cervical: No superficial, deep or posterior cervical adenopathy.  Skin:    General: Skin is warm and dry.     Capillary Refill: Capillary refill  takes less than 2 seconds.     Coloration: Skin is not jaundiced or pale.     Findings: No bruising, erythema, lesion or rash.  Neurological:     General: No focal deficit present.     Mental Status: He is alert and oriented to person, place, and time. Mental status is at baseline.     GCS: GCS eye subscore is 4. GCS verbal subscore is 5. GCS motor subscore is 6.     Sensory: Sensation is intact. No sensory deficit.     Motor: Motor function is intact. No weakness.     Coordination: Coordination is intact.     Gait: Gait is intact.  Psychiatric:        Attention and Perception: Attention and perception normal.        Mood and Affect: Mood and affect normal.        Speech: Speech normal.        Behavior: Behavior normal. Behavior is cooperative.        Thought Content: Thought content normal.        Cognition and Memory: Cognition normal.        Judgment: Judgment normal.     Last depression screening scores    03/16/2023    8:41 AM 04/22/2021    4:25 PM 04/09/2020    8:43 AM  PHQ 2/9 Scores  PHQ - 2 Score 2 5 4   PHQ- 9 Score 4 10 6    Last fall risk screening    03/16/2023    8:41 AM  Fall Risk   Falls in the past year? 0  Injury with  Fall? 0  Risk for fall due to : No Fall Risks  Follow up Falls evaluation completed   Last Audit-C alcohol use screening    03/15/2023    9:56 AM  Alcohol Use Disorder Test (AUDIT)  1. How often do you have a drink containing alcohol? 2  2. How many drinks containing alcohol do you have on a typical day when you are drinking? 0  3. How often do you have six or more drinks on one occasion? 0  AUDIT-C Score 2   A score of 3 or more in women, and 4 or more in men indicates increased risk for alcohol abuse, EXCEPT if all of the points are from question 1   No results found for any visits on 03/16/23.  Assessment & Plan    Routine Health Maintenance and Physical Exam  Exercise Activities and Dietary recommendations  Goals   None      Immunization History  Administered Date(s) Administered   Influenza Split 06/18/2006, 06/23/2007, 05/04/2008, 05/21/2011, 08/21/2012   Influenza,inj,Quad PF,6+ Mos 06/07/2013, 05/22/2016, 06/06/2017, 04/25/2018, 06/12/2019, 04/23/2021   Influenza-Unspecified 05/15/2022   Moderna Sars-Covid-2 Vaccination 07/06/2020   PFIZER(Purple Top)SARS-COV-2 Vaccination 10/18/2019, 11/06/2019   Td 08/31/2018   Tdap 05/04/2008   Zoster Recombinant(Shingrix) 04/22/2021, 06/24/2021    Health Maintenance  Topic Date Due   HIV Screening  Never done   Hepatitis C Screening  Never done   Colonoscopy  07/15/2021   COVID-19 Vaccine (4 - 2023-24 season) 04/09/2022   INFLUENZA VACCINE  11/07/2023 (Originally 03/10/2023)   DTaP/Tdap/Td (3 - Td or Tdap) 08/31/2028   Zoster Vaccines- Shingrix  Completed   HPV VACCINES  Aged Out    Discussed health benefits of physical activity, and encouraged him to engage in regular exercise appropriate for his age and condition.  Problem List Items Addressed This Visit       Digestive   Irritable bowel syndrome with both constipation and diarrhea    Reports flares every 6 weeks; due for colon cancer screening       Relevant Medications   Eluxadoline (VIBERZI) 100 MG TABS     Other   Annual physical exam - Primary    UTD on dental and vision Denies concerns PHQ 9 remains elevated; hx of depression Pt reports work has been challenging in this economy Things to do to keep yourself healthy  - Exercise at least 30-45 minutes a day, 3-4 days a week.  - Eat a low-fat diet with lots of fruits and vegetables, up to 7-9 servings per day.  - Seatbelts can save your life. Wear them always.  - Smoke detectors on every level of your home, check batteries every year.  - Eye Doctor - have an eye exam every 1-2 years  - Safe sex - if you may be exposed to STDs, use a condom.  - Alcohol -  If you drink, do it moderately, less than 2 drinks per day.  - Health Care Power of  Attorney. Choose someone to speak for you if you are not able.  - Depression is common in our stressful world.If you're feeling down or losing interest in things you normally enjoy, please come in for a visit.  - Violence - If anyone is threatening or hurting you, please call immediately. Due for labs Reports he takes viberzi 100 mg BID; gets samples from another office. Will add to Rx list today.      Relevant Orders   PSA   Comprehensive metabolic  panel   CBC with Differential/Platelet   TSH   Lipid panel   Encounter for screening for HIV    Low risk screen Consented; encouraged to "know your status" Recommend repeat screen if risk factors change       Relevant Orders   HIV antibody (with reflex)   Need for hepatitis C screening test    Low risk screen Treatable, and curable. If left untreated Hep C can lead to cirrhosis and liver failure. Encourage routine testing; recommend repeat testing if risk factors change.       Relevant Orders   Hepatitis C Antibody   Screening for colon cancer    Due for screening; last reviewed in 2012      Relevant Orders   Ambulatory referral to Gastroenterology   Return in about 1 year (around 03/15/2024) for annual examination.    Leilani Merl, FNP, have reviewed all documentation for this visit. The documentation on 03/16/23 for the exam, diagnosis, procedures, and orders are all accurate and complete.  Jacky Kindle, FNP  [facility] [phone] (phone) [phone] (fax)  [facility]

## 2023-03-24 ENCOUNTER — Other Ambulatory Visit: Payer: Self-pay | Admitting: *Deleted

## 2023-03-24 ENCOUNTER — Telehealth: Payer: Self-pay | Admitting: *Deleted

## 2023-03-24 ENCOUNTER — Other Ambulatory Visit: Payer: Self-pay

## 2023-03-24 DIAGNOSIS — Z800 Family history of malignant neoplasm of digestive organs: Secondary | ICD-10-CM

## 2023-03-24 DIAGNOSIS — Z1211 Encounter for screening for malignant neoplasm of colon: Secondary | ICD-10-CM

## 2023-03-24 MED ORDER — NA SULFATE-K SULFATE-MG SULF 17.5-3.13-1.6 GM/177ML PO SOLN
1.0000 | Freq: Once | ORAL | 0 refills | Status: AC
  Filled 2023-03-24: qty 354, fill #0
  Filled 2023-05-18: qty 354, 1d supply, fill #0

## 2023-03-24 NOTE — Telephone Encounter (Signed)
Gastroenterology Pre-Procedure Review  Request Date: 05/20/2023 Requesting Physician: Dr. Tobi Bastos  PATIENT REVIEW QUESTIONS: The patient responded to the following health history questions as indicated:    1. Are you having any GI issues? yes (hemorrhoids) 2. Do you have a personal history of Polyps? no 3. Do you have a family history of Colon Cancer or Polyps? yes (maternal grandmother) 4. Diabetes Mellitus? no 5. Joint replacements in the past 12 months?no 6. Major health problems in the past 3 months?no 7. Any artificial heart valves, MVP, or defibrillator?no    MEDICATIONS & ALLERGIES:    Patient reports the following regarding taking any anticoagulation/antiplatelet therapy:   Plavix, Coumadin, Eliquis, Xarelto, Lovenox, Pradaxa, Brilinta, or Effient? no Aspirin? no  Patient confirms/reports the following medications:  Current Outpatient Medications  Medication Sig Dispense Refill   Eluxadoline (VIBERZI) 100 MG TABS Take 1 tablet (100 mg total) by mouth in the morning and at bedtime.     No current facility-administered medications for this visit.    Patient confirms/reports the following allergies:  Allergies  Allergen Reactions   Penicillins     No orders of the defined types were placed in this encounter.   AUTHORIZATION INFORMATION Primary Insurance: 1D#: Group #:  Secondary Insurance: 1D#: Group #:  SCHEDULE INFORMATION: Date: 05/20/2023  Time: Location:  [facility]

## 2023-04-06 ENCOUNTER — Other Ambulatory Visit: Payer: Self-pay

## 2023-04-06 ENCOUNTER — Other Ambulatory Visit: Payer: Self-pay | Admitting: Family Medicine

## 2023-04-06 ENCOUNTER — Telehealth: Payer: Self-pay | Admitting: Family Medicine

## 2023-04-06 MED ORDER — VIBERZI 100 MG PO TABS
100.0000 mg | ORAL_TABLET | Freq: Two times a day (BID) | ORAL | 3 refills | Status: AC
Start: 2023-04-06 — End: ?
  Filled 2023-04-06: qty 180, 90d supply, fill #0
  Filled 2023-05-03: qty 60, 30d supply, fill #0
  Filled 2023-05-11 – 2023-06-10 (×2): qty 180, 90d supply, fill #0

## 2023-04-06 NOTE — Telephone Encounter (Signed)
[facility] requesting prescription refill Key: [id number] Name: Julian Frank 100MG  Tablets

## 2023-04-18 NOTE — Telephone Encounter (Signed)
Received a fax from covermymeds for Viberzi  Key: [id number]  States PA has been started but requires additional action to complete

## 2023-04-18 NOTE — Telephone Encounter (Signed)
Renew PA with the new key of [id number] and answered all the questions and send it to [employer]. Now waiting on response

## 2023-05-03 ENCOUNTER — Other Ambulatory Visit: Payer: Self-pay

## 2023-05-11 ENCOUNTER — Other Ambulatory Visit: Payer: Self-pay

## 2023-05-18 ENCOUNTER — Other Ambulatory Visit: Payer: Self-pay

## 2023-05-20 ENCOUNTER — Encounter
Admission: RE | Disposition: A | Discharge status: Home / Self Care | Payer: Self-pay | Source: Ambulatory Visit | Attending: Gastroenterology

## 2023-05-20 ENCOUNTER — Ambulatory Visit: Payer: 59 | Admitting: Anesthesiology

## 2023-05-20 ENCOUNTER — Ambulatory Visit
Admission: RE | Admit: 2023-05-20 | Discharge: 2023-05-20 | Disposition: A | Discharge status: Home / Self Care | Payer: 59 | Source: Ambulatory Visit | Attending: Gastroenterology | Admitting: Gastroenterology

## 2023-05-20 ENCOUNTER — Encounter: Payer: Self-pay | Admitting: Gastroenterology

## 2023-05-20 DIAGNOSIS — Z1211 Encounter for screening for malignant neoplasm of colon: Secondary | ICD-10-CM | POA: Insufficient documentation

## 2023-05-20 DIAGNOSIS — Z800 Family history of malignant neoplasm of digestive organs: Secondary | ICD-10-CM | POA: Diagnosis not present

## 2023-05-20 HISTORY — PX: COLONOSCOPY WITH PROPOFOL: SHX5780

## 2023-05-20 HISTORY — DX: Irritable bowel syndrome, unspecified: K58.9

## 2023-05-20 SURGERY — COLONOSCOPY WITH PROPOFOL
Anesthesia: General

## 2023-05-20 MED ORDER — DEXMEDETOMIDINE HCL IN NACL 80 MCG/20ML IV SOLN
INTRAVENOUS | Status: DC | PRN
  Administered 2023-05-20: 8 ug via INTRAVENOUS

## 2023-05-20 MED ORDER — PHENYLEPHRINE HCL (PRESSORS) 10 MG/ML IV SOLN: INTRAVENOUS | Status: DC | PRN

## 2023-05-20 MED ORDER — LIDOCAINE HCL (CARDIAC) PF 100 MG/5ML IV SOSY
PREFILLED_SYRINGE | INTRAVENOUS | Status: DC | PRN
  Administered 2023-05-20: 60 mg via INTRAVENOUS

## 2023-05-20 MED ORDER — PHENYLEPHRINE 80 MCG/ML (10ML) SYRINGE FOR IV PUSH (FOR BLOOD PRESSURE SUPPORT)
PREFILLED_SYRINGE | INTRAVENOUS | Status: DC | PRN
Start: 2023-05-20 — End: 2023-05-20
  Administered 2023-05-20: 80 ug via INTRAVENOUS
  Administered 2023-05-20 (×2): 160 ug via INTRAVENOUS

## 2023-05-20 MED ORDER — PROPOFOL 10 MG/ML IV BOLUS
INTRAVENOUS | Status: DC | PRN
  Administered 2023-05-20: 70 mg via INTRAVENOUS

## 2023-05-20 MED ORDER — PROPOFOL 500 MG/50ML IV EMUL
INTRAVENOUS | Status: DC | PRN
  Administered 2023-05-20: 140 ug/kg/min via INTRAVENOUS

## 2023-05-20 MED ORDER — SODIUM CHLORIDE 0.9 % IV SOLN: INTRAVENOUS | Status: DC

## 2023-05-20 NOTE — H&P (Signed)
Wyline Mood, MD [address], [address], [place], [place], [address] [address], [place], [place], [address] Phone: [phone]  Fax: [phone]  Primary Care Physician:  Jacky Kindle, FNP   Pre-Procedure History & Physical: HPI:  Ashleigh Arya is a 52 y.o. male is here for an colonoscopy.   Past Medical History:  Diagnosis Date   Irritable bowel     Past Surgical History:  Procedure Laterality Date   COLONOSCOPY  07/2011   HEMORRHOID BANDING  07/2011    Prior to Admission medications   Medication Sig Start Date End Date Taking? Authorizing Provider  Eluxadoline (VIBERZI) 100 MG TABS Take 1 tablet (100 mg total) by mouth in the morning and at bedtime. 04/06/23  Yes Jacky Kindle, FNP    Allergies as of 03/24/2023 - Review Complete 03/16/2023  Allergen Reaction Noted   Penicillins  01/18/2009    Family History  Problem Relation Age of Onset   Alcohol abuse Mother    Hypertension Father    Healthy Sister    Colon cancer Maternal Grandmother    Healthy Sister     Social History   Socioeconomic History   Marital status: Married    Spouse name: Not on file   Number of children: Not on file   Years of education: Not on file   Highest education level: Bachelor's degree (e.g., BA, AB, BS)  Occupational History   Not on file  Tobacco Use   Smoking status: Never   Smokeless tobacco: Never  Vaping Use   Vaping status: Never Used  Substance and Sexual Activity   Alcohol use: Yes    Comment: Occasional   Drug use: No   Sexual activity: Not on file  Other Topics Concern   Not on file  Social History Narrative   Not on file   Social Determinants of Health   Financial Resource Strain: Low Risk  (03/15/2023)   Overall Financial Resource Strain (CARDIA)    Difficulty of Paying Living Expenses: Not hard at all  Food Insecurity: No Food Insecurity (03/15/2023)   Hunger Vital Sign    Worried About Running Out of Food in the Last Year: Never  true    Ran Out of Food in the Last Year: Never true  Transportation Needs: No Transportation Needs (03/15/2023)   PRAPARE - Administrator, Civil Service (Medical): No    Lack of Transportation (Non-Medical): No  Physical Activity: Unknown (03/15/2023)   Exercise Vital Sign    Days of Exercise per Week: 0 days    Minutes of Exercise per Session: Not on file  Stress: Stress Concern Present (03/15/2023)   [employer] of Occupational Health - Occupational Stress Questionnaire    Feeling of Stress : To some extent  Social Connections: Moderately Isolated (03/15/2023)   Social Connection and Isolation Panel [NHANES]    Frequency of Communication with Friends and Family: Once a week    Frequency of Social Gatherings with Friends and Family: Once a week    Attends Religious Services: 1 to 4 times per year    Active Member of [employer] or Organizations: No    Attends Engineer, structural: Not on file    Marital Status: Married  Catering manager Violence: Not on file    Review of Systems: See HPI, otherwise negative ROS  Physical Exam: BP (!) 127/92   Pulse 72   Temp (!) 96.6 F (35.9 C) (Temporal)   Resp  16   Wt 103.2 kg   SpO2 100%   BMI 28.45 kg/m  General:   Alert,  pleasant and cooperative in NAD Head:  Normocephalic and atraumatic. Neck:  Supple; no masses or thyromegaly. Lungs:  Clear throughout to auscultation, normal respiratory effort.    Heart:  +S1, +S2, Regular rate and rhythm, No edema. Abdomen:  Soft, nontender and nondistended. Normal bowel sounds, without guarding, and without rebound.   Neurologic:  Alert and  oriented x4;  grossly normal neurologically.  Impression/Plan: Lawanda Cousins is here for an colonoscopy to be performed for Screening colonoscopy average risk   Risks, benefits, limitations, and alternatives regarding  colonoscopy have been reviewed with the patient.  Questions have been answered.  All parties agreeable.   Wyline Mood, MD   05/20/2023, 7:50 AM

## 2023-05-20 NOTE — Anesthesia Preprocedure Evaluation (Signed)
Anesthesia Evaluation  Patient identified by MRN, date of birth, ID band Patient awake    Reviewed: Allergy & Precautions, NPO status , Patient's Chart, lab work & pertinent test results  Airway Mallampati: III  TM Distance: >3 FB Neck ROM: Full    Dental  (+) Teeth Intact   Pulmonary neg pulmonary ROS   Pulmonary exam normal breath sounds clear to auscultation       Cardiovascular Exercise Tolerance: Good negative cardio ROS Normal cardiovascular exam Rhythm:Regular Rate:Normal     Neuro/Psych negative neurological ROS  negative psych ROS   GI/Hepatic negative GI ROS, Neg liver ROS,,,  Endo/Other  negative endocrine ROS    Renal/GU negative Renal ROS  negative genitourinary   Musculoskeletal   Abdominal  (+) + obese  Peds negative pediatric ROS (+)  Hematology negative hematology ROS (+)   Anesthesia Other Findings Past Medical History: No date: Irritable bowel  Past Surgical History: 07/2011: COLONOSCOPY 07/2011: HEMORRHOID BANDING  BMI    Body Mass Index: 28.45 kg/m      Reproductive/Obstetrics negative OB ROS                             Anesthesia Physical Anesthesia Plan  ASA: 2  Anesthesia Plan: General   Post-op Pain Management:    Induction: Intravenous  PONV Risk Score and Plan: Propofol infusion and TIVA  Airway Management Planned: Natural Airway and Nasal Cannula  Additional Equipment:   Intra-op Plan:   Post-operative Plan:   Informed Consent: I have reviewed the patients History and Physical, chart, labs and discussed the procedure including the risks, benefits and alternatives for the proposed anesthesia with the patient or authorized representative who has indicated his/her understanding and acceptance.     Dental Advisory Given  Plan Discussed with: CRNA and Surgeon  Anesthesia Plan Comments:        Anesthesia Quick Evaluation

## 2023-05-20 NOTE — Op Note (Signed)
[facility] Gastroenterology Patient Name: Julian Frank Procedure Date: 05/20/2023 8:15 AM MRN: [id number] Account #: [id number] Date of Birth: 04/12/1971 Admit Type: Outpatient Age: 52 Room: [facility] ENDO ROOM 4 Gender: Male Note Status: Finalized Instrument Name: Prentice Docker [id number] Procedure:             Colonoscopy Indications:           Screening in patient at increased risk: Family history                         of 1st-degree relative with colorectal cancer Providers:             Wyline Mood MD, MD Referring MD:          Daryl Eastern. Suzie Portela (Referring MD) Medicines:             Monitored Anesthesia Care Complications:         No immediate complications. Procedure:             Pre-Anesthesia Assessment:                        - Prior to the procedure, a History and Physical was                         performed, and patient medications, allergies and                         sensitivities were reviewed. The patient's tolerance                         of previous anesthesia was reviewed.                        - The risks and benefits of the procedure and the                         sedation options and risks were discussed with the                         patient. All questions were answered and informed                         consent was obtained.                        - ASA Grade Assessment: II - A patient with mild                         systemic disease.                        After obtaining informed consent, the colonoscope was                         passed under direct vision. Throughout the procedure,                         the patient's blood pressure, pulse, and oxygen  saturations were monitored continuously. The                         Colonoscope was introduced through the anus and                         advanced to the the cecum, identified by the                         appendiceal orifice. The colonoscopy was performed                          with ease. The patient tolerated the procedure well.                         The quality of the bowel preparation was excellent.                         The ileocecal valve, appendiceal orifice, and rectum                         were photographed. Findings:      The entire examined colon appeared normal on direct and retroflexion       views. Impression:            - The entire examined colon is normal on direct and                         retroflexion views.                        - No specimens collected. Recommendation:        - Discharge patient to home (with escort).                        - Resume previous diet.                        - Continue present medications.                        - Repeat colonoscopy in 5 years for surveillance. Procedure Code(s):     --- Professional ---                        [id number], Colonoscopy, flexible; diagnostic, including                         collection of specimen(s) by brushing or washing, when                         performed (separate procedure) Diagnosis Code(s):     --- Professional ---                        Z80.0, Family history of malignant neoplasm of                         digestive organs CPT copyright 2022 [employer]. All rights reserved. The codes documented in this report are preliminary and upon coder review  may  be revised to meet current compliance requirements. Wyline Mood, MD Wyline Mood MD, MD 05/20/2023 8:35:37 AM This report has been signed electronically. Number of Addenda: 0 Note Initiated On: 05/20/2023 8:15 AM Scope Withdrawal Time: 0 hours 10 minutes 29 seconds  Total Procedure Duration: 0 hours 13 minutes 24 seconds  Estimated Blood Loss:  Estimated blood loss: none.      [facility]

## 2023-05-20 NOTE — Transfer of Care (Signed)
Immediate Anesthesia Transfer of Care Note  Patient: Julian Frank  Procedure(s) Performed: COLONOSCOPY WITH PROPOFOL  Patient Location: PACU  Anesthesia Type:General  Level of Consciousness: awake, alert , and oriented  Airway & Oxygen Therapy: Patient Spontanous Breathing  Post-op Assessment: Report given to RN and Post -op Vital signs reviewed and stable  Post vital signs: Reviewed and stable  Last Vitals:  Vitals Value Taken Time  BP 112/81 05/20/23 0840  Temp 35.8 C 05/20/23 0838  Pulse 56 05/20/23 0840  Resp 11 05/20/23 0840  SpO2 99 % 05/20/23 0840  Vitals shown include unfiled device data.  Last Pain:  Vitals:   05/20/23 0838  TempSrc: Temporal  PainSc: Asleep         Complications: No notable events documented.

## 2023-05-20 NOTE — Anesthesia Postprocedure Evaluation (Signed)
Anesthesia Post Note  Patient: Julian Frank  Procedure(s) Performed: COLONOSCOPY WITH PROPOFOL  Patient location during evaluation: PACU Anesthesia Type: General Level of consciousness: awake Pain management: satisfactory to patient Vital Signs Assessment: post-procedure vital signs reviewed and stable Cardiovascular status: stable Anesthetic complications: no   No notable events documented.   Last Vitals:  Vitals:   05/20/23 0850 05/20/23 0859  BP: 110/77 115/88  Pulse: 66 65  Resp: 15 (!) 7  Temp:    SpO2: 100% 100%    Last Pain:  Vitals:   05/20/23 0850  TempSrc:   PainSc: 0-No pain                 VAN STAVEREN,Nikeia Henkes

## 2023-05-21 NOTE — Progress Notes (Signed)
Voicemail.  No Message Left. 

## 2023-05-23 ENCOUNTER — Encounter: Payer: Self-pay | Admitting: Gastroenterology

## 2023-06-07 ENCOUNTER — Other Ambulatory Visit: Payer: Self-pay | Admitting: Family Medicine

## 2023-06-10 ENCOUNTER — Other Ambulatory Visit: Payer: Self-pay

## 2023-07-25 ENCOUNTER — Other Ambulatory Visit: Payer: Self-pay
